# Patient Record
Sex: Female | Born: 1960 | Race: White | Hispanic: No | Marital: Married | Smoking: Former smoker
Health system: Southern US, Community
[De-identification: ages and names within clinical notes are randomized; demographics above are authoritative.]

## PROBLEM LIST (undated history)

## (undated) DIAGNOSIS — N83209 Unspecified ovarian cyst, unspecified side: Secondary | ICD-10-CM

## (undated) DIAGNOSIS — I10 Essential (primary) hypertension: Secondary | ICD-10-CM

## (undated) DIAGNOSIS — M1990 Unspecified osteoarthritis, unspecified site: Secondary | ICD-10-CM

## (undated) DIAGNOSIS — M797 Fibromyalgia: Secondary | ICD-10-CM

## (undated) DIAGNOSIS — K449 Diaphragmatic hernia without obstruction or gangrene: Secondary | ICD-10-CM

## (undated) DIAGNOSIS — D219 Benign neoplasm of connective and other soft tissue, unspecified: Secondary | ICD-10-CM

## (undated) HISTORY — PX: TONSILLECTOMY: SUR1361

## (undated) HISTORY — PX: APPENDECTOMY: SHX54

## (undated) HISTORY — DX: Essential (primary) hypertension: I10

## (undated) HISTORY — PX: LAPAROSCOPY: SHX197

## (undated) HISTORY — DX: Unspecified osteoarthritis, unspecified site: M19.90

---

## 2001-01-13 ENCOUNTER — Emergency Department (HOSPITAL_COMMUNITY): Admission: EM | Admit: 2001-01-13 | Discharge: 2001-01-13 | Payer: Self-pay | Admitting: *Deleted

## 2001-01-13 ENCOUNTER — Encounter: Payer: Self-pay | Admitting: Emergency Medicine

## 2008-03-01 ENCOUNTER — Inpatient Hospital Stay (HOSPITAL_COMMUNITY): Admission: AD | Admit: 2008-03-01 | Discharge: 2008-03-02 | Payer: Self-pay | Admitting: Obstetrics & Gynecology

## 2009-05-09 ENCOUNTER — Emergency Department (HOSPITAL_BASED_OUTPATIENT_CLINIC_OR_DEPARTMENT_OTHER): Admission: EM | Admit: 2009-05-09 | Discharge: 2009-05-09 | Payer: Self-pay | Admitting: Emergency Medicine

## 2009-05-09 ENCOUNTER — Ambulatory Visit: Payer: Self-pay | Admitting: Interventional Radiology

## 2009-05-31 ENCOUNTER — Emergency Department (HOSPITAL_BASED_OUTPATIENT_CLINIC_OR_DEPARTMENT_OTHER): Admission: EM | Admit: 2009-05-31 | Discharge: 2009-05-31 | Payer: Self-pay | Admitting: Emergency Medicine

## 2009-05-31 ENCOUNTER — Ambulatory Visit: Payer: Self-pay | Admitting: Diagnostic Radiology

## 2009-06-20 ENCOUNTER — Emergency Department (HOSPITAL_BASED_OUTPATIENT_CLINIC_OR_DEPARTMENT_OTHER): Admission: EM | Admit: 2009-06-20 | Discharge: 2009-06-20 | Payer: Self-pay | Admitting: Emergency Medicine

## 2009-06-30 ENCOUNTER — Emergency Department (HOSPITAL_BASED_OUTPATIENT_CLINIC_OR_DEPARTMENT_OTHER): Admission: EM | Admit: 2009-06-30 | Discharge: 2009-06-30 | Payer: Self-pay | Admitting: Emergency Medicine

## 2009-09-16 ENCOUNTER — Emergency Department (HOSPITAL_BASED_OUTPATIENT_CLINIC_OR_DEPARTMENT_OTHER): Admission: EM | Admit: 2009-09-16 | Discharge: 2009-09-16 | Payer: Self-pay | Admitting: Emergency Medicine

## 2010-01-12 ENCOUNTER — Encounter
Admission: RE | Admit: 2010-01-12 | Discharge: 2010-01-12 | Payer: Self-pay | Source: Home / Self Care | Attending: Physical Medicine & Rehabilitation | Admitting: Physical Medicine & Rehabilitation

## 2010-04-28 ENCOUNTER — Emergency Department (HOSPITAL_BASED_OUTPATIENT_CLINIC_OR_DEPARTMENT_OTHER)
Admission: EM | Admit: 2010-04-28 | Discharge: 2010-04-28 | Disposition: A | Discharge status: Home / Self Care | Payer: Self-pay | Attending: Obstetrics and Gynecology | Admitting: Obstetrics and Gynecology

## 2010-04-28 DIAGNOSIS — IMO0001 Reserved for inherently not codable concepts without codable children: Secondary | ICD-10-CM | POA: Insufficient documentation

## 2010-04-28 DIAGNOSIS — I10 Essential (primary) hypertension: Secondary | ICD-10-CM | POA: Insufficient documentation

## 2010-04-28 DIAGNOSIS — G8929 Other chronic pain: Secondary | ICD-10-CM | POA: Insufficient documentation

## 2010-04-28 DIAGNOSIS — E669 Obesity, unspecified: Secondary | ICD-10-CM | POA: Insufficient documentation

## 2010-04-28 DIAGNOSIS — Z79899 Other long term (current) drug therapy: Secondary | ICD-10-CM | POA: Insufficient documentation

## 2010-04-28 DIAGNOSIS — M545 Low back pain, unspecified: Secondary | ICD-10-CM | POA: Insufficient documentation

## 2010-04-28 DIAGNOSIS — M542 Cervicalgia: Secondary | ICD-10-CM | POA: Insufficient documentation

## 2010-05-29 LAB — URINALYSIS, ROUTINE W REFLEX MICROSCOPIC
Glucose, UA: NEGATIVE mg/dL
Hgb urine dipstick: NEGATIVE
Specific Gravity, Urine: 1.009 (ref 1.005–1.030)

## 2010-05-30 LAB — URINALYSIS, ROUTINE W REFLEX MICROSCOPIC
Nitrite: NEGATIVE
Specific Gravity, Urine: 1.011 (ref 1.005–1.030)
pH: 6.5 (ref 5.0–8.0)

## 2010-05-30 LAB — PREGNANCY, URINE: Preg Test, Ur: NEGATIVE

## 2010-06-04 LAB — URINALYSIS, ROUTINE W REFLEX MICROSCOPIC
Protein, ur: NEGATIVE mg/dL
Urobilinogen, UA: 0.2 mg/dL (ref 0.0–1.0)

## 2010-06-04 LAB — COMPREHENSIVE METABOLIC PANEL
AST: 35 U/L (ref 0–37)
Albumin: 4.9 g/dL (ref 3.5–5.2)
Chloride: 107 mEq/L (ref 96–112)
Creatinine, Ser: 0.7 mg/dL (ref 0.4–1.2)
GFR calc Af Amer: >60 mL/min (ref >60)
Potassium: 4 mEq/L (ref 3.5–5.1)
Total Bilirubin: 0.4 mg/dL (ref 0.3–1.2)

## 2010-06-04 LAB — DIFFERENTIAL
Basophils Absolute: 0.1 10*3/uL (ref 0.0–0.1)
Eosinophils Relative: 0 % (ref 0–5)
Lymphocytes Relative: 13 % (ref 12–46)
Monocytes Absolute: 0.3 10*3/uL (ref 0.1–1.0)

## 2010-06-04 LAB — CBC
MCV: 89.6 fL (ref 78.0–100.0)
Platelets: 628 10*3/uL — ABNORMAL HIGH (ref 150–400)
WBC: 12.2 10*3/uL — ABNORMAL HIGH (ref 4.0–10.5)

## 2010-06-04 LAB — PREGNANCY, URINE: Preg Test, Ur: NEGATIVE

## 2010-09-04 ENCOUNTER — Emergency Department (INDEPENDENT_AMBULATORY_CARE_PROVIDER_SITE_OTHER): Payer: Self-pay

## 2010-09-04 ENCOUNTER — Emergency Department (HOSPITAL_BASED_OUTPATIENT_CLINIC_OR_DEPARTMENT_OTHER)
Admission: EM | Admit: 2010-09-04 | Discharge: 2010-09-04 | Disposition: A | Discharge status: Home / Self Care | Payer: Self-pay | Attending: Emergency Medicine | Admitting: Emergency Medicine

## 2010-09-04 DIAGNOSIS — IMO0001 Reserved for inherently not codable concepts without codable children: Secondary | ICD-10-CM | POA: Insufficient documentation

## 2010-09-04 DIAGNOSIS — I10 Essential (primary) hypertension: Secondary | ICD-10-CM | POA: Insufficient documentation

## 2010-09-04 DIAGNOSIS — R1032 Left lower quadrant pain: Secondary | ICD-10-CM | POA: Insufficient documentation

## 2010-09-04 DIAGNOSIS — R109 Unspecified abdominal pain: Secondary | ICD-10-CM

## 2010-09-04 DIAGNOSIS — N809 Endometriosis, unspecified: Secondary | ICD-10-CM

## 2010-09-04 LAB — CBC
Hemoglobin: 13.6 g/dL (ref 12.0–15.0)
MCH: 29.3 pg (ref 26.0–34.0)
MCV: 84.3 fL (ref 78.0–100.0)
RBC: 4.64 MIL/uL (ref 3.87–5.11)

## 2010-09-04 LAB — URINALYSIS, ROUTINE W REFLEX MICROSCOPIC
Glucose, UA: NEGATIVE mg/dL
Hgb urine dipstick: NEGATIVE
Specific Gravity, Urine: 1.018 (ref 1.005–1.030)
pH: 5.5 (ref 5.0–8.0)

## 2010-09-04 LAB — DIFFERENTIAL
Lymphocytes Relative: 32 % (ref 12–46)
Lymphs Abs: 2.1 10*3/uL (ref 0.7–4.0)
Monocytes Relative: 8 % (ref 3–12)
Neutro Abs: 3.9 10*3/uL (ref 1.7–7.7)
Neutrophils Relative %: 59 % (ref 43–77)

## 2010-09-04 LAB — BASIC METABOLIC PANEL
CO2: 26 mEq/L (ref 19–32)
Calcium: 10.2 mg/dL (ref 8.4–10.5)
Chloride: 101 mEq/L (ref 96–112)
Creatinine, Ser: 0.5 mg/dL (ref 0.50–1.10)
Glucose, Bld: 99 mg/dL (ref 70–99)

## 2010-09-26 ENCOUNTER — Emergency Department (HOSPITAL_BASED_OUTPATIENT_CLINIC_OR_DEPARTMENT_OTHER)
Admission: EM | Admit: 2010-09-26 | Discharge: 2010-09-26 | Disposition: A | Discharge status: Home / Self Care | Payer: Self-pay | Attending: Emergency Medicine | Admitting: Emergency Medicine

## 2010-09-26 ENCOUNTER — Emergency Department (INDEPENDENT_AMBULATORY_CARE_PROVIDER_SITE_OTHER): Payer: Self-pay

## 2010-09-26 ENCOUNTER — Encounter: Payer: Self-pay | Admitting: *Deleted

## 2010-09-26 DIAGNOSIS — R1032 Left lower quadrant pain: Secondary | ICD-10-CM

## 2010-09-26 DIAGNOSIS — R1084 Generalized abdominal pain: Secondary | ICD-10-CM

## 2010-09-26 DIAGNOSIS — IMO0001 Reserved for inherently not codable concepts without codable children: Secondary | ICD-10-CM | POA: Insufficient documentation

## 2010-09-26 DIAGNOSIS — R110 Nausea: Secondary | ICD-10-CM | POA: Insufficient documentation

## 2010-09-26 DIAGNOSIS — R197 Diarrhea, unspecified: Secondary | ICD-10-CM

## 2010-09-26 HISTORY — DX: Diaphragmatic hernia without obstruction or gangrene: K44.9

## 2010-09-26 HISTORY — DX: Fibromyalgia: M79.7

## 2010-09-26 LAB — DIFFERENTIAL
Eosinophils Absolute: 0.1 10*3/uL (ref 0.0–0.7)
Eosinophils Relative: 1 % (ref 0–5)
Lymphs Abs: 3 10*3/uL (ref 0.7–4.0)
Monocytes Absolute: 0.7 10*3/uL (ref 0.1–1.0)

## 2010-09-26 LAB — COMPREHENSIVE METABOLIC PANEL
ALT: 25 U/L (ref 0–35)
CO2: 22 mEq/L (ref 19–32)
Calcium: 10 mg/dL (ref 8.4–10.5)
Creatinine, Ser: 0.6 mg/dL (ref 0.50–1.10)
GFR calc Af Amer: >60 mL/min (ref >60)
GFR calc non Af Amer: >60 mL/min (ref >60)
Glucose, Bld: 98 mg/dL (ref 70–99)
Sodium: 138 mEq/L (ref 135–145)
Total Protein: 7.9 g/dL (ref 6.0–8.3)

## 2010-09-26 LAB — URINALYSIS, ROUTINE W REFLEX MICROSCOPIC
Ketones, ur: NEGATIVE mg/dL
Leukocytes, UA: NEGATIVE
Nitrite: NEGATIVE
Protein, ur: NEGATIVE mg/dL
Urobilinogen, UA: 0.2 mg/dL (ref 0.0–1.0)

## 2010-09-26 LAB — CBC
HCT: 39.3 % (ref 36.0–46.0)
MCH: 29.5 pg (ref 26.0–34.0)
MCV: 84.5 fL (ref 78.0–100.0)
Platelets: 410 10*3/uL — ABNORMAL HIGH (ref 150–400)
RBC: 4.65 MIL/uL (ref 3.87–5.11)
RDW: 13.9 % (ref 11.5–15.5)

## 2010-09-26 LAB — PREGNANCY, URINE: Preg Test, Ur: NEGATIVE

## 2010-09-26 LAB — LIPASE, BLOOD: Lipase: 54 U/L (ref 11–59)

## 2010-09-26 MED ORDER — HYDROCODONE-ACETAMINOPHEN 5-325 MG PO TABS: 2.0000 | ORAL_TABLET | ORAL | Status: AC | PRN

## 2010-09-26 MED ORDER — SODIUM CHLORIDE 0.9 % IV SOLN
Freq: Once | INTRAVENOUS | Status: AC
  Administered 2010-09-26: 19:00:00 via INTRAVENOUS

## 2010-09-26 MED ORDER — IOHEXOL 300 MG/ML  SOLN
100.0000 mL | Freq: Once | INTRAMUSCULAR | Status: AC | PRN
  Administered 2010-09-26: 100 mL via INTRAVENOUS

## 2010-09-26 MED ORDER — ONDANSETRON HCL 4 MG/2ML IJ SOLN
4.0000 mg | Freq: Once | INTRAMUSCULAR | Status: AC
  Administered 2010-09-26: 4 mg via INTRAVENOUS
  Filled 2010-09-26: qty 2

## 2010-09-26 MED ORDER — HYDROMORPHONE HCL 1 MG/ML IJ SOLN
1.0000 mg | Freq: Once | INTRAMUSCULAR | Status: AC
  Administered 2010-09-26: 1 mg via INTRAVENOUS
  Filled 2010-09-26: qty 1

## 2010-09-26 NOTE — ED Provider Notes (Signed)
History     Chief Complaint  Patient presents with  . Abdominal Pain   Patient is a 50 y.o. female presenting with abdominal pain. The history is provided by the patient.  Abdominal Pain The primary symptoms of the illness include abdominal pain and nausea. The current episode started 2 days ago. The onset of the illness was gradual. The problem has been gradually worsening.  The patient states that she believes she is currently not pregnant. The patient has not had a change in bowel habit.    Past Medical History  Diagnosis Date  . Endometriosis   . Hiatal hernia   . Fibromyalgia     Past Surgical History  Procedure Date  . Tonsillectomy     History reviewed. No pertinent family history.  History  Substance Use Topics  . Smoking status: Never Smoker   . Smokeless tobacco: Not on file  . Alcohol Use: No    OB History    Grav Para Term Preterm Abortions TAB SAB Ect Mult Living                  Review of Systems  Gastrointestinal: Positive for nausea and abdominal pain.  All other systems reviewed and are negative.    Physical Exam  BP 160/76  Pulse 89  Temp(Src) 99.1 F (37.3 C) (Oral)  Resp 16  Wt 220 lb (99.791 kg)  SpO2 100%  LMP 08/10/2010  Physical Exam  Constitutional: She is oriented to person, place, and time. She appears well-developed and well-nourished.  HENT:  Head: Normocephalic.  Eyes: Conjunctivae are normal. Pupils are equal, round, and reactive to light.  Neck: Normal range of motion. Neck supple.  Cardiovascular: Normal rate.   Pulmonary/Chest: Effort normal.  Abdominal: Soft. There is tenderness. There is guarding.  Musculoskeletal: Normal range of motion.  Neurological: She is alert and oriented to person, place, and time.  Skin: Skin is warm.  Psychiatric: She has a normal mood and affect.    ED Course  Procedures  MDM Pt reports her doctor has referred her to pain management for fibromyalgia.  Pt reports she haspain from  endometrosis as well.  Medical screening examination/treatment/procedure(s) were performed by non-physician practitioner and as supervising physician I was immediately available for consultation/collaboration.    Langston Masker, [place] 09/26/10 1925  Suzi Roots, MD 09/27/10 [place] [facility]

## 2010-09-26 NOTE — ED Notes (Signed)
Pt returned from CT.  Improvement in overall condition.

## 2010-09-26 NOTE — ED Notes (Signed)
Pt transported to CT ?

## 2010-09-26 NOTE — ED Notes (Signed)
IV started, medicated and pt drinking PO contrast.

## 2010-09-26 NOTE — ED Notes (Signed)
Pt c/o left side abd pain x 2 days, Hx of same, F/u appt this month with [facility] pain management. PT also c/o n/d.

## 2010-09-26 NOTE — ED Notes (Signed)
Pt ambulatory to BR and returned to room.  NAD.

## 2010-10-02 ENCOUNTER — Encounter (HOSPITAL_BASED_OUTPATIENT_CLINIC_OR_DEPARTMENT_OTHER): Payer: Self-pay | Admitting: *Deleted

## 2010-10-02 ENCOUNTER — Emergency Department (INDEPENDENT_AMBULATORY_CARE_PROVIDER_SITE_OTHER): Payer: Self-pay

## 2010-10-02 ENCOUNTER — Emergency Department (HOSPITAL_BASED_OUTPATIENT_CLINIC_OR_DEPARTMENT_OTHER)
Admission: EM | Admit: 2010-10-02 | Discharge: 2010-10-02 | Disposition: A | Discharge status: Home / Self Care | Payer: Self-pay | Attending: Emergency Medicine | Admitting: Emergency Medicine

## 2010-10-02 DIAGNOSIS — W2203XA Walked into furniture, initial encounter: Secondary | ICD-10-CM | POA: Insufficient documentation

## 2010-10-02 DIAGNOSIS — S93699A Other sprain of unspecified foot, initial encounter: Secondary | ICD-10-CM | POA: Insufficient documentation

## 2010-10-02 DIAGNOSIS — Y92009 Unspecified place in unspecified non-institutional (private) residence as the place of occurrence of the external cause: Secondary | ICD-10-CM | POA: Insufficient documentation

## 2010-10-02 DIAGNOSIS — M79609 Pain in unspecified limb: Secondary | ICD-10-CM

## 2010-10-02 DIAGNOSIS — R51 Headache: Secondary | ICD-10-CM

## 2010-10-02 DIAGNOSIS — IMO0001 Reserved for inherently not codable concepts without codable children: Secondary | ICD-10-CM | POA: Insufficient documentation

## 2010-10-02 DIAGNOSIS — M25579 Pain in unspecified ankle and joints of unspecified foot: Secondary | ICD-10-CM

## 2010-10-02 DIAGNOSIS — R609 Edema, unspecified: Secondary | ICD-10-CM

## 2010-10-02 DIAGNOSIS — M7750 Other enthesopathy of unspecified foot: Secondary | ICD-10-CM

## 2010-10-02 DIAGNOSIS — IMO0002 Reserved for concepts with insufficient information to code with codable children: Secondary | ICD-10-CM

## 2010-10-02 MED ORDER — TRAMADOL HCL 50 MG PO TABS: 50.0000 mg | ORAL_TABLET | Freq: Four times a day (QID) | ORAL | Status: AC | PRN

## 2010-10-02 NOTE — ED Provider Notes (Signed)
History     Chief Complaint  Patient presents with  . Foot Pain  . Ankle Pain   HPI Comments: Pt states that she opened a drawer onto her foot and has continued to have pain  Patient is a 50 y.o. female presenting with lower extremity pain and ankle pain. The history is provided by the patient. No language interpreter was used.  Foot Pain This is a new problem. The current episode started in the past 7 days. The problem occurs constantly. The problem has been gradually worsening. The symptoms are aggravated by standing. She has tried nothing for the symptoms.  Ankle Pain  The incident occurred 2 days ago. The incident occurred at home. The injury mechanism was a direct blow. The pain is present in the left foot. The quality of the pain is described as aching. The pain is at a severity of 3/10. The pain is mild. The pain has been constant since onset. Pertinent negatives include no muscle weakness, no loss of sensation and no tingling. She reports no foreign bodies present. The symptoms are aggravated by nothing. She has tried nothing for the symptoms. The treatment provided no relief.    Past Medical History  Diagnosis Date  . Endometriosis   . Hiatal hernia   . Fibromyalgia     Past Surgical History  Procedure Date  . Tonsillectomy     No family history on file.  History  Substance Use Topics  . Smoking status: Never Smoker   . Smokeless tobacco: Not on file  . Alcohol Use: No    OB History    Grav Para Term Preterm Abortions TAB SAB Ect Mult Living                  Review of Systems  Neurological: Negative for tingling.    Physical Exam  BP 163/99  Pulse 110  Temp(Src) 98.9 F (37.2 C) (Oral)  Resp 16  Ht 5\' 4"  (1.626 m)  Wt 230 lb (104.327 kg)  BMI 39.48 kg/m2  SpO2 98%  LMP 08/10/2010  Physical Exam  Vitals reviewed. Constitutional: She is oriented to person, place, and time. She appears well-developed and well-nourished.  HENT:  Head:  Normocephalic.  Neck: Normal range of motion.  Cardiovascular: Normal rate and regular rhythm.   Pulmonary/Chest: Effort normal and breath sounds normal.  Musculoskeletal:       Right ankle: Normal.       Right foot: Normal.  Neurological: She is alert and oriented to person, place, and time.    ED Course  Procedures   Ankle Complete Left  10/02/2010  *RADIOLOGY REPORT*  Clinical Data: Pain/injury to the nose, lateral ankle swelling  LEFT ANKLE COMPLETE - 3+ VIEW  Comparison: None.  Findings: No fracture or dislocation is seen.  The ankle mortise is preserved.  Possible mild generalized ankle swelling, most prominent medially.  IMPRESSION: No fracture or dislocation is seen.  Original Report Authenticated By: Charline Bills, M.D.   Dg Foot Complete Left  10/02/2010  *RADIOLOGY REPORT*  Clinical Data: Pain/injury to toes  LEFT FOOT - COMPLETE 3+ VIEW  Comparison: None.  Findings: No fracture or dislocation is seen.  The joint spaces are preserved.  The visualized soft tissues are unremarkable.  Plantar calcaneal enthesopathy.  IMPRESSION: No fracture or dislocation is seen.  Original Report Authenticated By: Charline Bills, M.D.         MDM No acute process noted:pt okay to go home;pt is requesting ultram for  home      Teressa Lower, [place] 10/02/10 1433

## 2010-10-02 NOTE — ED Notes (Signed)
Pt says a dresser drawer fell onto her left foot on Saturday.  She c/o pain in left foot and swelling in left ankle.

## 2010-10-03 NOTE — ED Provider Notes (Signed)
Medical screening examination/treatment/procedure(s) were performed by non-physician practitioner and as supervising physician I was immediately available for consultation/collaboration.  Estephania Licciardi 10/03/10 1542 

## 2010-11-29 ENCOUNTER — Emergency Department (HOSPITAL_COMMUNITY)
Admission: EM | Admit: 2010-11-29 | Discharge: 2010-11-29 | Disposition: A | Discharge status: Home / Self Care | Payer: Self-pay | Attending: Emergency Medicine | Admitting: Emergency Medicine

## 2010-11-29 DIAGNOSIS — R51 Headache: Secondary | ICD-10-CM | POA: Insufficient documentation

## 2010-11-29 DIAGNOSIS — H9209 Otalgia, unspecified ear: Secondary | ICD-10-CM | POA: Insufficient documentation

## 2010-11-29 DIAGNOSIS — I10 Essential (primary) hypertension: Secondary | ICD-10-CM | POA: Insufficient documentation

## 2010-12-14 LAB — CBC
HCT: 36.6 % (ref 36.0–46.0)
MCHC: 33.9 g/dL (ref 30.0–36.0)
MCV: 89.4 fL (ref 78.0–100.0)
Platelets: 387 10*3/uL (ref 150–400)
WBC: 10.5 10*3/uL (ref 4.0–10.5)

## 2010-12-14 LAB — DIFFERENTIAL
Basophils Absolute: 0 10*3/uL (ref 0.0–0.1)
Lymphocytes Relative: 27 % (ref 12–46)
Lymphs Abs: 2.8 10*3/uL (ref 0.7–4.0)
Monocytes Absolute: 0.8 10*3/uL (ref 0.1–1.0)
Neutro Abs: 6.6 10*3/uL (ref 1.7–7.7)

## 2010-12-14 LAB — COMPREHENSIVE METABOLIC PANEL
Albumin: 3.4 g/dL — ABNORMAL LOW (ref 3.5–5.2)
BUN: 7 mg/dL (ref 6–23)
Calcium: 9.5 mg/dL (ref 8.4–10.5)
Chloride: 101 mEq/L (ref 96–112)
Creatinine, Ser: 0.63 mg/dL (ref 0.4–1.2)
GFR calc Af Amer: >60 mL/min (ref >60)
GFR calc non Af Amer: >60 mL/min (ref >60)
Total Bilirubin: 0.2 mg/dL — ABNORMAL LOW (ref 0.3–1.2)

## 2010-12-14 LAB — GC/CHLAMYDIA PROBE AMP, GENITAL: Chlamydia, DNA Probe: NEGATIVE

## 2010-12-14 LAB — WET PREP, GENITAL
Trich, Wet Prep: NONE SEEN
Yeast Wet Prep HPF POC: NONE SEEN

## 2010-12-14 LAB — HCG, SERUM, QUALITATIVE: Preg, Serum: NEGATIVE

## 2011-01-31 ENCOUNTER — Encounter (HOSPITAL_BASED_OUTPATIENT_CLINIC_OR_DEPARTMENT_OTHER): Payer: Self-pay | Admitting: *Deleted

## 2011-01-31 ENCOUNTER — Emergency Department (HOSPITAL_BASED_OUTPATIENT_CLINIC_OR_DEPARTMENT_OTHER)
Admission: EM | Admit: 2011-01-31 | Discharge: 2011-01-31 | Disposition: A | Discharge status: Home / Self Care | Payer: Self-pay | Attending: Emergency Medicine | Admitting: Emergency Medicine

## 2011-01-31 DIAGNOSIS — IMO0001 Reserved for inherently not codable concepts without codable children: Secondary | ICD-10-CM | POA: Insufficient documentation

## 2011-01-31 DIAGNOSIS — R109 Unspecified abdominal pain: Secondary | ICD-10-CM | POA: Insufficient documentation

## 2011-01-31 HISTORY — DX: Unspecified ovarian cyst, unspecified side: N83.209

## 2011-01-31 LAB — URINALYSIS, ROUTINE W REFLEX MICROSCOPIC
Bilirubin Urine: NEGATIVE
Glucose, UA: NEGATIVE mg/dL
Hgb urine dipstick: NEGATIVE
Ketones, ur: NEGATIVE mg/dL
pH: 7 (ref 5.0–8.0)

## 2011-01-31 MED ORDER — TRAMADOL HCL 50 MG PO TABS
50.0000 mg | ORAL_TABLET | Freq: Once | ORAL | Status: AC
  Administered 2011-01-31: 50 mg via ORAL
  Filled 2011-01-31: qty 1

## 2011-01-31 MED ORDER — TRAMADOL HCL 50 MG PO TABS: 50.0000 mg | ORAL_TABLET | Freq: Four times a day (QID) | ORAL | Status: AC | PRN

## 2011-01-31 NOTE — ED Provider Notes (Signed)
Medical screening examination/treatment/procedure(s) were performed by non-physician practitioner and as supervising physician I was immediately available for consultation/collaboration.   Forbes Cellar, MD 01/31/11 [phone]

## 2011-01-31 NOTE — ED Provider Notes (Addendum)
History     CSN: [id number] Arrival date & time: 01/31/2011  1:19 PM   First MD Initiated Contact with Patient 01/31/11 1335      Chief Complaint  Patient presents with  . Abdominal Pain    (Consider location/radiation/quality/duration/timing/severity/associated sxs/prior treatment) HPI Comments: Pt states that she got her period for the first time in a couple of months and she uses tramadol for pain and she ran out of it:pt states that the bleeding has just about stopped  Patient is a 50 y.o. female presenting with abdominal pain. The history is provided by the patient. No language interpreter was used.  Abdominal Pain The primary symptoms of the illness include abdominal pain. The primary symptoms of the illness do not include nausea, vomiting or dysuria. The current episode started more than 2 days ago. The onset of the illness was gradual. The problem has not changed since onset. The patient states that she believes she is currently not pregnant. The patient has not had a change in bowel habit. Symptoms associated with the illness do not include urgency or hematuria.    Past Medical History  Diagnosis Date  . Endometriosis   . Hiatal hernia   . Fibromyalgia   . Ovarian cyst     Past Surgical History  Procedure Date  . Tonsillectomy     History reviewed. No pertinent family history.  History  Substance Use Topics  . Smoking status: Never Smoker   . Smokeless tobacco: Not on file  . Alcohol Use: No    OB History    Grav Para Term Preterm Abortions TAB SAB Ect Mult Living                  Review of Systems  Gastrointestinal: Positive for abdominal pain. Negative for nausea and vomiting.  Genitourinary: Negative for dysuria, urgency and hematuria.  All other systems reviewed and are negative.    Allergies  Morphine and related and Rocephin  Home Medications   Current Outpatient Rx  Name Route Sig Dispense Refill  . CARISOPRODOL 350 MG PO TABS Oral Take  350 mg by mouth 4 (four) times daily as needed.      Marland Kitchen TRAMADOL HCL 50 MG PO TABS Oral Take 50 mg by mouth every 6 (six) hours as needed.        BP 165/96  Pulse 100  Temp(Src) 98.1 F (36.7 C) (Oral)  Resp 16  Ht 5\' 3"  (1.6 m)  Wt 230 lb (104.327 kg)  BMI 40.74 kg/m2  SpO2 99%  LMP 01/29/2011  Physical Exam  Nursing note and vitals reviewed. Constitutional: She is oriented to person, place, and time. She appears well-developed and well-nourished.  HENT:  Head: Normocephalic and atraumatic.  Cardiovascular: Normal rate and regular rhythm.   Pulmonary/Chest: Effort normal and breath sounds normal.  Abdominal: Soft. Bowel sounds are normal. There is no tenderness.  Musculoskeletal: Normal range of motion.  Neurological: She is alert and oriented to person, place, and time.  Skin: Skin is warm and dry.    ED Course  Procedures (including critical care time)   Labs Reviewed  URINALYSIS, ROUTINE W REFLEX MICROSCOPIC  PREGNANCY, URINE   No results found.   No diagnosis found.    MDM  Abdomen non acute:will treat symptomatically        Teressa Lower, NP 01/31/11 1431  Teressa Lower, NP 01/31/11 1441

## 2011-01-31 NOTE — ED Notes (Signed)
Pt c/o lower abd pain with vaginal bleeding x 3 days, pt states she is out of tramadol

## 2011-01-31 NOTE — ED Provider Notes (Signed)
Medical screening examination/treatment/procedure(s) were performed by non-physician practitioner and as supervising physician I was immediately available for consultation/collaboration.   Leigh-Ann Magaby Rumberger, MD 01/31/11 1450 

## 2011-05-16 ENCOUNTER — Emergency Department (HOSPITAL_BASED_OUTPATIENT_CLINIC_OR_DEPARTMENT_OTHER)
Admission: EM | Admit: 2011-05-16 | Discharge: 2011-05-16 | Disposition: A | Discharge status: Home / Self Care | Payer: Self-pay | Attending: Emergency Medicine | Admitting: Emergency Medicine

## 2011-05-16 ENCOUNTER — Encounter (HOSPITAL_BASED_OUTPATIENT_CLINIC_OR_DEPARTMENT_OTHER): Payer: Self-pay | Admitting: *Deleted

## 2011-05-16 DIAGNOSIS — Z79899 Other long term (current) drug therapy: Secondary | ICD-10-CM | POA: Insufficient documentation

## 2011-05-16 DIAGNOSIS — M79609 Pain in unspecified limb: Secondary | ICD-10-CM | POA: Insufficient documentation

## 2011-05-16 DIAGNOSIS — L03019 Cellulitis of unspecified finger: Secondary | ICD-10-CM | POA: Insufficient documentation

## 2011-05-16 DIAGNOSIS — M7989 Other specified soft tissue disorders: Secondary | ICD-10-CM | POA: Insufficient documentation

## 2011-05-16 MED ORDER — TRAMADOL HCL 50 MG PO TABS: 50.0000 mg | ORAL_TABLET | Freq: Four times a day (QID) | ORAL | Status: AC | PRN

## 2011-05-16 MED ORDER — OXYCODONE-ACETAMINOPHEN 5-325 MG PO TABS
2.0000 | ORAL_TABLET | Freq: Once | ORAL | Status: AC
  Administered 2011-05-16: 2 via ORAL
  Filled 2011-05-16: qty 2

## 2011-05-16 MED ORDER — LIDOCAINE HCL 2 % IJ SOLN
20.0000 mL | Freq: Once | INTRAMUSCULAR | Status: AC
  Administered 2011-05-16: 20 mg via INTRADERMAL
  Filled 2011-05-16: qty 1

## 2011-05-16 NOTE — ED Provider Notes (Signed)
History     CSN: [id number]  Arrival date & time 05/16/11  1124   First MD Initiated Contact with Patient 05/16/11 1220      Chief Complaint  Patient presents with  . Wound Infection    Right thumb  . Medication Refill    (Consider location/radiation/quality/duration/timing/severity/associated sxs/prior treatment) HPI Patient follow with pain management due to fibromyalgia.  She has not been to see since February 1 and has not taking oxycodone but had soma filled.  Patient hd infection of right thumb treated by Dr. Ivory Broad and treated with sulfa antibiotics five days ago and states it is not healing.  She was supposed to see Dr. Ivory Broad if not better by Monday.  She was unable to go back to see both doctors due to financial reasons so came here.    Past Medical History  Diagnosis Date  . Endometriosis   . Hiatal hernia   . Fibromyalgia   . Ovarian cyst     Past Surgical History  Procedure Date  . Tonsillectomy     No family history on file.  History  Substance Use Topics  . Smoking status: Former Games developer  . Smokeless tobacco: Not on file  . Alcohol Use: No    OB History    Grav Para Term Preterm Abortions TAB SAB Ect Mult Living                  Review of Systems  All other systems reviewed and are negative.    Allergies  Morphine and related and Rocephin  Home Medications   Current Outpatient Rx  Name Route Sig Dispense Refill  . OXYCODONE-ACETAMINOPHEN 5-325 MG PO TABS Oral Take 2 tablets by mouth every 6 (six) hours as needed.    Marland Kitchen CARISOPRODOL 350 MG PO TABS Oral Take 350 mg by mouth 4 (four) times daily as needed.      Marland Kitchen TRAMADOL HCL 50 MG PO TABS Oral Take 50 mg by mouth every 6 (six) hours as needed.        BP 133/89  Pulse 100  Temp(Src) 98 F (36.7 C) (Oral)  Resp 20  Ht 5\' 4"  (1.626 m)  Wt 230 lb (104.327 kg)  BMI 39.48 kg/m2  SpO2 100%  LMP 04/18/2011  Physical Exam  Nursing note and vitals reviewed. Constitutional: She is oriented to  person, place, and time. She appears well-developed and well-nourished.  HENT:  Head: Normocephalic and atraumatic.  Eyes: Conjunctivae and EOM are normal. Pupils are equal, round, and reactive to light.  Neck: Normal range of motion.  Cardiovascular: Normal rate and regular rhythm.   Pulmonary/Chest: Effort normal.  Abdominal: Soft.  Musculoskeletal:       Left thumb with paronychial redness and swelling  Neurological: She is alert and oriented to person, place, and time.  Skin: Skin is warm and dry.  Psychiatric: She has a normal mood and affect.    ED Course  INCISION AND DRAINAGE Date/Time: 05/16/2011 1:07 PM Performed by: Hilario Quarry Authorized by: Hilario Quarry Consent: Verbal consent obtained. Written consent obtained. Risks and benefits: risks, benefits and alternatives were discussed Patient identity confirmed: verbally with patient Time out: Immediately prior to procedure a "time out" was called to verify the correct patient, procedure, equipment, support staff and site/side marked as required. Type: abscess Body area: upper extremity Location details: right thumb Anesthesia: digital block Local anesthetic: lidocaine 1% without epinephrine Anesthetic total: 4 ml Patient sedated: no Scalpel size: 11 Needle  gauge: 22 Incision type: single straight Complexity: simple Drainage: serosanguinous Drainage amount: moderate Wound treatment: wound left open   (including critical care time)  Labs Reviewed - No data to display No results found.   No diagnosis found.    MDM          Hilario Quarry, MD 05/16/11 1308

## 2011-05-16 NOTE — ED Notes (Signed)
Pt amb to room 6 with quick steady gait in nad. Pt reports hitting her thumb on a wall last week, saw her pcp who dx infection, "he said it was mrsa", pt states she is on day 5 of sulfa antibiotics, unsure of name, cont with swelling and pain. Pt was to follow up with her pcp but cannot afford another office visit. She also cannot afford to go to her pain clinic, and states she needs pain medications also.

## 2011-05-16 NOTE — Discharge Instructions (Signed)
Paronychia  Paronychia is an inflammatory reaction involving the folds of the skin surrounding the fingernail. This is commonly caused by an infection in the skin around a nail. The most common cause of paronychia is frequent wetting of the hands (as seen with bartenders, food servers, nurses or others who wet their hands). This makes the skin around the fingernail susceptible to infection by bacteria (germs) or fungus. Other predisposing factors are:   Aggressive manicuring.   Nail biting.   Thumb sucking.  The most common cause is a staphylococcal (a type of germ) infection, or a fungal (Candida) infection. When caused by a germ, it usually comes on suddenly with redness, swelling, pus and is often painful. It may get under the nail and form an abscess (collection of pus), or form an abscess around the nail. If the nail itself is infected with a fungus, the treatment is usually prolonged and may require oral medicine for up to one year. Your caregiver will determine the length of time treatment is required. The paronychia caused by bacteria (germs) may largely be avoided by not pulling on hangnails or picking at cuticles. When the infection occurs at the tips of the finger it is called felon. When the cause of paronychia is from the herpes simplex virus (HSV) it is called herpetic whitlow.  TREATMENT   When an abscess is present treatment is often incision and drainage. This means that the abscess must be cut open so the pus can get out. When this is done, the following home care instructions should be followed.  HOME CARE INSTRUCTIONS    It is important to keep the affected fingers very dry. Rubber or plastic gloves over cotton gloves should be used whenever the hand must be placed in water.   Keep wound clean, dry and dressed as suggested by your caregiver between warm soaks or warm compresses.   Soak in warm water for fifteen to twenty minutes three to four times per day for bacterial infections. Fungal  infections are very difficult to treat, so often require treatment for long periods of time.   For bacterial (germ) infections take antibiotics (medicine which kill germs) as directed and finish the prescription, even if the problem appears to be solved before the medicine is gone.   Only take over-the-counter or prescription medicines for pain, discomfort, or fever as directed by your caregiver.  SEEK IMMEDIATE MEDICAL CARE IF:   You have redness, swelling, or increasing pain in the wound.   You notice pus coming from the wound.   You have a fever.   You notice a bad smell coming from the wound or dressing.  Document Released: 08/21/2000 Document Revised: 02/14/2011 Document Reviewed: 04/22/2008  [employer] Patient Information 2012 [employer]

## 2011-05-16 NOTE — ED Notes (Signed)
Patient reports that 5 days ago she injured her right thumb and was seen by her PCP for the possible infection.  Patient was given antibiotic for thumb.  Patient also reports that she has been out of her pain medications for approx 7 days and has started experiencing head, neck and back spasms.

## 2011-07-07 ENCOUNTER — Encounter (HOSPITAL_BASED_OUTPATIENT_CLINIC_OR_DEPARTMENT_OTHER): Payer: Self-pay

## 2011-07-07 ENCOUNTER — Emergency Department (HOSPITAL_BASED_OUTPATIENT_CLINIC_OR_DEPARTMENT_OTHER)
Admission: EM | Admit: 2011-07-07 | Discharge: 2011-07-07 | Disposition: A | Discharge status: Home / Self Care | Payer: Self-pay | Attending: Emergency Medicine | Admitting: Emergency Medicine

## 2011-07-07 DIAGNOSIS — R000 Tachycardia, unspecified: Secondary | ICD-10-CM | POA: Insufficient documentation

## 2011-07-07 DIAGNOSIS — M1710 Unilateral primary osteoarthritis, unspecified knee: Secondary | ICD-10-CM | POA: Insufficient documentation

## 2011-07-07 DIAGNOSIS — M25569 Pain in unspecified knee: Secondary | ICD-10-CM | POA: Insufficient documentation

## 2011-07-07 DIAGNOSIS — IMO0001 Reserved for inherently not codable concepts without codable children: Secondary | ICD-10-CM | POA: Insufficient documentation

## 2011-07-07 DIAGNOSIS — I10 Essential (primary) hypertension: Secondary | ICD-10-CM | POA: Insufficient documentation

## 2011-07-07 DIAGNOSIS — R109 Unspecified abdominal pain: Secondary | ICD-10-CM | POA: Insufficient documentation

## 2011-07-07 MED ORDER — HYDROCODONE-ACETAMINOPHEN 5-325 MG PO TABS
ORAL_TABLET | ORAL | Status: AC
  Administered 2011-07-07: 2 via ORAL
  Filled 2011-07-07: qty 2

## 2011-07-07 MED ORDER — TRAMADOL HCL 50 MG PO TABS: 50.0000 mg | ORAL_TABLET | Freq: Four times a day (QID) | ORAL | Status: AC | PRN

## 2011-07-07 MED ORDER — HYDROCODONE-ACETAMINOPHEN 5-325 MG PO TABS
2.0000 | ORAL_TABLET | Freq: Once | ORAL | Status: AC
  Administered 2011-07-07: 2 via ORAL

## 2011-07-07 NOTE — ED Notes (Signed)
Pt with multiple complaints r/t pain and lack of pain management at home, pt reports that she is out of her tramadol and oxycodone which she takes for pain at home.  Pt states that she has pain contract with a doctor but that she cannot afford to go back to him because she does not have insurance.  Pt states that she has bilateral knee pain and her L knee is "giving out".

## 2011-07-07 NOTE — Discharge Instructions (Signed)
Knee Pain The knee is the complex joint between your thigh and your lower leg. It is made up of bones, tendons, ligaments, and cartilage. The bones that make up the knee are:  The femur in the thigh.   The tibia and fibula in the lower leg.   The patella or kneecap riding in the groove on the lower femur.  CAUSES  Knee pain is a common complaint with many causes. A few of these causes are:  Injury, such as:   A ruptured ligament or tendon injury.   Torn cartilage.   Medical conditions, such as:   Gout   Arthritis   Infections   Overuse, over training or overdoing a physical activity.  Knee pain can be minor or severe. Knee pain can accompany debilitating injury. Minor knee problems often respond well to self-care measures or get well on their own. More serious injuries may need medical intervention or even surgery. SYMPTOMS The knee is complex. Symptoms of knee problems can vary widely. Some of the problems are:  Pain with movement and weight bearing.   Swelling and tenderness.   Buckling of the knee.   Inability to straighten or extend your knee.   Your knee locks and you cannot straighten it.   Warmth and redness with pain and fever.   Deformity or dislocation of the kneecap.  DIAGNOSIS  Determining what is wrong may be very straight forward such as when there is an injury. It can also be challenging because of the complexity of the knee. Tests to make a diagnosis may include:  Your caregiver taking a history and doing a physical exam.   Routine X-rays can be used to rule out other problems. X-rays will not reveal a cartilage tear. Some injuries of the knee can be diagnosed by:   Arthroscopy a surgical technique by which a small video camera is inserted through tiny incisions on the sides of the knee. This procedure is used to examine and repair internal knee joint problems. Tiny instruments can be used during arthroscopy to repair the torn knee cartilage  (meniscus).   Arthrography is a radiology technique. A contrast liquid is directly injected into the knee joint. Internal structures of the knee joint then become visible on X-ray film.   An MRI scan is a non x-ray radiology procedure in which magnetic fields and a computer produce two- or three-dimensional images of the inside of the knee. Cartilage tears are often visible using an MRI scanner. MRI scans have largely replaced arthrography in diagnosing cartilage tears of the knee.   Blood work.   Examination of the fluid that helps to lubricate the knee joint (synovial fluid). This is done by taking a sample out using a needle and a syringe.  TREATMENT The treatment of knee problems depends on the cause. Some of these treatments are:  Depending on the injury, proper casting, splinting, surgery or physical therapy care will be needed.   Give yourself adequate recovery time. Do not overuse your joints. If you begin to get sore during workout routines, back off. Slow down or do fewer repetitions.   For repetitive activities such as cycling or running, maintain your strength and nutrition.   Alternate muscle groups. For example if you are a weight lifter, work the upper body on one day and the lower body the next.   Either tight or weak muscles do not give the proper support for your knee. Tight or weak muscles do not absorb the stress placed   on the knee joint. Keep the muscles surrounding the knee strong.   Take care of mechanical problems.   If you have flat feet, orthotics or special shoes may help. See your caregiver if you need help.   Arch supports, sometimes with wedges on the inner or outer aspect of the heel, can help. These can shift pressure away from the side of the knee most bothered by osteoarthritis.   A brace called an "unloader" brace also may be used to help ease the pressure on the most arthritic side of the knee.   If your caregiver has prescribed crutches, braces,  wraps or ice, use as directed. The acronym for this is PRICE. This means protection, rest, ice, compression and elevation.   Nonsteroidal anti-inflammatory drugs (NSAID's), can help relieve pain. But if taken immediately after an injury, they may actually increase swelling. Take NSAID's with food in your stomach. Stop them if you develop stomach problems. Do not take these if you have a history of ulcers, stomach pain or bleeding from the bowel. Do not take without your caregiver's approval if you have problems with fluid retention, heart failure, or kidney problems.   For ongoing knee problems, physical therapy may be helpful.   Glucosamine and chondroitin are over-the-counter dietary supplements. Both may help relieve the pain of osteoarthritis in the knee. These medicines are different from the usual anti-inflammatory drugs. Glucosamine may decrease the rate of cartilage destruction.   Injections of a corticosteroid drug into your knee joint may help reduce the symptoms of an arthritis flare-up. They may provide pain relief that lasts a few months. You may have to wait a few months between injections. The injections do have a small increased risk of infection, water retention and elevated blood sugar levels.   Hyaluronic acid injected into damaged joints may ease pain and provide lubrication. These injections may work by reducing inflammation. A series of shots may give relief for as long as 6 months.   Topical painkillers. Applying certain ointments to your skin may help relieve the pain and stiffness of osteoarthritis. Ask your pharmacist for suggestions. Many over the-counter products are approved for temporary relief of arthritis pain.   In some countries, doctors often prescribe topical NSAID's for relief of chronic conditions such as arthritis and tendinitis. A review of treatment with NSAID creams found that they worked as well as oral medications but without the serious side effects.    PREVENTION  Maintain a healthy weight. Extra pounds put more strain on your joints.   Get strong, stay limber. Weak muscles are a common cause of knee injuries. Stretching is important. Include flexibility exercises in your workouts.   Be smart about exercise. If you have osteoarthritis, chronic knee pain or recurring injuries, you may need to change the way you exercise. This does not mean you have to stop being active. If your knees ache after jogging or playing basketball, consider switching to swimming, water aerobics or other low-impact activities, at least for a few days a week. Sometimes limiting high-impact activities will provide relief.   Make sure your shoes fit well. Choose footwear that is right for your sport.   Protect your knees. Use the proper gear for knee-sensitive activities. Use kneepads when playing volleyball or laying carpet. Buckle your seat belt every time you drive. Most shattered kneecaps occur in car accidents.   Rest when you are tired.  SEEK MEDICAL CARE IF:  You have knee pain that is continual and does not   seem to be getting better.  SEEK IMMEDIATE MEDICAL CARE IF:  Your knee joint feels hot to the touch and you have a high fever. MAKE SURE YOU:   Understand these instructions.   Will watch your condition.   Will get help right away if you are not doing well or get worse.  Document Released: 12/23/2006 Document Revised: 02/14/2011 Document Reviewed: 12/23/2006 [facility] Patient Information 2012 [place], [place].    Call Dr. Ivory Broad to arrange to your blood pressure rechecked within a week. Take your Benicar when you get home today. Call Dr.DUDA if you wish to get your knees evaluated by an orthopedic surgeon. Take tramadol as needed for pain

## 2011-07-07 NOTE — ED Provider Notes (Signed)
History     CSN: [id number]  Arrival date & time 07/07/11  1055   First MD Initiated Contact with Patient 07/07/11 1211      Chief Complaint  Patient presents with  . Pain    (Consider location/radiation/quality/duration/timing/severity/associated sxs/prior treatment) HPI Complains of left knee pain for one week and right knee pain for one year. She's been evaluated by [facility]oped[facility] for right knee pain and cold that she has severe arthritis and needs a knee replacement. Left knee pain started one week ago which is worse with walking. She's been treating pain with tramadol with some relief , however she ran out of tramadol this morning. Tylenol also gives her some relief her in patient also complains of left flank pain which is chronic for several years which is typical of her fibromyalgia. She denies having a pain contract or pain management clinic in contradiction to nurse's notes no other associated symptoms. No other complaint.  Past Medical History  Diagnosis Date  . Endometriosis   . Hiatal hernia   . Fibromyalgia   . Ovarian cyst    hypertension ; current medications include Benicar; Past Surgical History  Procedure Date  . Tonsillectomy     History reviewed. No pertinent family history.  History  Substance Use Topics  . Smoking status: Former Games developer  . Smokeless tobacco: Not on file  . Alcohol Use: No    OB History    Grav Para Term Preterm Abortions TAB SAB Ect Mult Living                  Review of Systems  Genitourinary: Positive for flank pain.  Musculoskeletal: Positive for arthralgias.  All other systems reviewed and are negative.    Allergies  Morphine and related and Rocephin  Home Medications   Current Outpatient Rx  Name Route Sig Dispense Refill  . CARISOPRODOL 350 MG PO TABS Oral Take 350 mg by mouth 4 (four) times daily as needed.      . OXYCODONE-ACETAMINOPHEN 5-325 MG PO TABS Oral Take 2 tablets by mouth every 6 (six) hours as  needed.    Marland Kitchen TRAMADOL HCL 50 MG PO TABS Oral Take 50 mg by mouth every 6 (six) hours as needed.       Benicar ; has not taken it today   BP 169/106  Pulse 116  Temp(Src) 98.5 F (36.9 C) (Oral)  Resp 22  Ht 5\' 3"  (1.6 m)  Wt 230 lb (104.327 kg)  BMI 40.74 kg/m2  SpO2 98%  Physical Exam  Nursing note and vitals reviewed. Constitutional: She appears well-developed and well-nourished.  HENT:  Head: Normocephalic and atraumatic.  Eyes: Conjunctivae are normal. Pupils are equal, round, and reactive to light.  Neck: Neck supple. No tracheal deviation present. No thyromegaly present.  Cardiovascular: Regular rhythm.   No murmur heard.      Mild tachycardia  Pulmonary/Chest: Effort normal and breath sounds normal.  Abdominal: Soft. Bowel sounds are normal. She exhibits no distension. There is no tenderness.       Obese  Genitourinary:       No flank tenderness  Musculoskeletal: Normal range of motion. She exhibits no edema and no tenderness.       All 4 extremities without redness swelling or tenderness, neurovascularly intact bilateral knees without effusion  Neurological: She is alert. Coordination normal.       Walks with cane unassisted  Skin: Skin is warm and dry. No rash noted.  Psychiatric:  Anxious appearing    ED Course  Procedures (including critical care time)  Labs Reviewed - No data to display No results found.   No diagnosis found.    MDM     tachycardia felt secondary to pain and/or anxiety   plan prescription for tramadol   referral Dr. Lajoyce Corners for knee pain   blood pressure rechecked one week She is encouraged to take her Benicar upon arrival home today Diagnosis #1 knee pain #2 chronic flank pain #3 hypertension      Doug Sou, MD 07/07/11 1254

## 2011-07-29 ENCOUNTER — Encounter (HOSPITAL_COMMUNITY): Payer: Self-pay | Admitting: Emergency Medicine

## 2011-07-29 ENCOUNTER — Emergency Department (HOSPITAL_COMMUNITY): Payer: Self-pay

## 2011-07-29 ENCOUNTER — Emergency Department (HOSPITAL_COMMUNITY)
Admission: EM | Admit: 2011-07-29 | Discharge: 2011-07-29 | Disposition: A | Discharge status: Home / Self Care | Payer: Self-pay | Attending: Emergency Medicine | Admitting: Emergency Medicine

## 2011-07-29 DIAGNOSIS — IMO0002 Reserved for concepts with insufficient information to code with codable children: Secondary | ICD-10-CM | POA: Insufficient documentation

## 2011-07-29 DIAGNOSIS — M25561 Pain in right knee: Secondary | ICD-10-CM

## 2011-07-29 DIAGNOSIS — M25569 Pain in unspecified knee: Secondary | ICD-10-CM | POA: Insufficient documentation

## 2011-07-29 DIAGNOSIS — Z79899 Other long term (current) drug therapy: Secondary | ICD-10-CM | POA: Insufficient documentation

## 2011-07-29 DIAGNOSIS — M1710 Unilateral primary osteoarthritis, unspecified knee: Secondary | ICD-10-CM | POA: Insufficient documentation

## 2011-07-29 MED ORDER — OXYCODONE-ACETAMINOPHEN 5-325 MG PO TABS: 2.0000 | ORAL_TABLET | ORAL | Status: AC | PRN

## 2011-07-29 MED ORDER — OXYCODONE-ACETAMINOPHEN 5-325 MG PO TABS
1.0000 | ORAL_TABLET | Freq: Once | ORAL | Status: AC
  Administered 2011-07-29: 1 via ORAL
  Filled 2011-07-29: qty 1

## 2011-07-29 MED ORDER — TRAMADOL HCL 50 MG PO TABS: ORAL_TABLET | ORAL | Status: DC

## 2011-07-29 MED ORDER — ONDANSETRON 4 MG PO TBDP
4.0000 mg | ORAL_TABLET | Freq: Once | ORAL | Status: AC
  Administered 2011-07-29: 4 mg via ORAL
  Filled 2011-07-29: qty 1

## 2011-07-29 NOTE — ED Provider Notes (Signed)
History     CSN: [id number]  Arrival date & time 07/29/11  1606   First MD Initiated Contact with Patient 07/29/11 1828      Chief Complaint  Patient presents with  . Knee Pain    (Consider location/radiation/quality/duration/timing/severity/associated sxs/prior treatment) HPI Hx from pt. 51yo F who presents with L knee pain. States that she's been told in the past that she has severe arthritis to the R knee and she likely needs a TKA on that side; R knee has been painful for over a year. She began to have pain in the left knee about 3-4 weeks ago which has been severe. States pain is described as throbbing in nature and radiates up her thigh. Worse with ambulation, improves with rest. Has not noted any swelling, redness, distal numbness/weakness. She has been using tramadol at home which is somewhat helpful, along with ice. Review of previous chart indicates that she was seen for the same several weeks ago at [facility] and instructed to f/u with ortho. States she has been unable to secure an appt with them as of yet due to insurance issues.  Past Medical History  Diagnosis Date  . Endometriosis   . Hiatal hernia   . Fibromyalgia   . Ovarian cyst     Past Surgical History  Procedure Date  . Tonsillectomy     History reviewed. No pertinent family history.  History  Substance Use Topics  . Smoking status: Former Games developer  . Smokeless tobacco: Not on file  . Alcohol Use: No    OB History    Grav Para Term Preterm Abortions TAB SAB Ect Mult Living                  Review of Systems  Constitutional: Negative for fever, chills, activity change and appetite change.  Musculoskeletal: Positive for arthralgias and gait problem.  Skin: Negative for color change and rash.    Allergies  Morphine and related and Rocephin  Home Medications   Current Outpatient Rx  Name Route Sig Dispense Refill  . CARISOPRODOL 350 MG PO TABS Oral Take 350 mg by mouth 4 (four) times daily  as needed. For pain.    Marland Kitchen NAPROXEN SODIUM 220 MG PO TABS Oral Take 220 mg by mouth 2 (two) times daily as needed. For pain.    Marland Kitchen TRAMADOL HCL 50 MG PO TABS Oral Take 100 mg by mouth every 4 (four) hours as needed. For pain.      BP 143/94  Pulse 114  Temp(Src) 98.2 F (36.8 C) (Oral)  Resp 22  Ht 5' 3.5" (1.613 m)  Wt 230 lb (104.327 kg)  BMI 40.10 kg/m2  SpO2 100%  Physical Exam  Nursing note and vitals reviewed. Constitutional: She appears well-developed and well-nourished. No distress.  HENT:  Head: Normocephalic and atraumatic.  Neck: Normal range of motion.  Cardiovascular: Normal rate.   Pulmonary/Chest: Effort normal.  Musculoskeletal: Normal range of motion.       Tender to palp along medial jt line of L knee. Knee is not edematous; no gross effusion. No crepitus or deformity. FROM although full extension causes pain. NVI distally. Pt ambulatory with a cane unassisted.  Neurological: She is alert.  Skin: Skin is warm and dry. She is not diaphoretic.  Psychiatric: She has a normal mood and affect.    ED Course  Procedures (including critical care time)  Labs Reviewed - No data to display Dg Knee Complete 4 Views Left  07/29/2011  *RADIOLOGY REPORT*  Clinical Data: Severe left knee pain.  No known injuries.  LEFT KNEE - COMPLETE 4+ VIEW 07/29/2011:  Comparison: None.  Findings: No evidence of acute, subacute, or healed fractures. Moderate medial and patellofemoral compartment joint space narrowing with associated hypertrophic spurring.  Lateral compartment joint space well preserved with associated spurring. No evidence of a significant joint effusion.  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  Original Report Authenticated By: Arnell Sieving, M.D.     1. Right knee pain       MDM  Pt with known severe OA to R knee presents with pain to L knee. Seen for the same several weeks ago. Xray taken today,  which I personally reviewed - tricompartment OA worst along medial jt line which corresponds with pt's pain. Explained to her the importance of f/u with ortho for this as jt injections will likely give her better long term pain relief than PO meds. Small rx for pain meds written. Return precautions discussed.        Grant Fontana, [place] 07/30/11 1338

## 2011-07-29 NOTE — Discharge Instructions (Signed)
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 Take the pain medication as needed.  RESOURCE GUIDE  Dental Problems  Patients with Medicaid: [facility] [phone]                                           [phone] [address] [employer] [address] Phone:  [phone]                                                  Phone:  [phone]  If unable to pay or uninsured, contact:  [facility] or [facility] to become qualified for the adult dental clinic.  Chronic Pain Problems Contact [facility]  [phone] Patients need to be referred by their primary care doctor.  Insufficient Money for Medicine Contact [employer] Way:  call "211" or [facility] 336-639-7797.  No Primary Care Doctor Call [facility]  [phone] Other agencies that provide inexpensive medical care    [facility]  [phone]    [facility]  [phone]    [facility]  [phone]    [facility]  [phone]    [facility]  [phone]    [facility]  [phone]  Psychological Services [facility]  [phone] [facility]  [phone] [facility]   [phone] (emergency services 802-725-1399)  Substance Abuse Resources [facility]  [phone] [facility] [phone] [facility] [phone] Floydene Flock [phone] Residential & Outpatient Substance Abuse Program  [phone]  Abuse/Neglect [facility] Child Abuse Hotline (445)085-8852 [facility] Child Abuse Hotline 256-561-5500 (After Hours)  Emergency Shelter [facility] (848)429-4689  Maternity Homes [facility] [place] [facility] [phone] [facility] 6828645207  MRSA Hotline #:   (401)140-0412    [facility]  Resources  [facility] [place]     [employer]                          [facility] Dept. [address] [place]                       [address]      [address]  [place]                                                Cristobal Goldmann Phone:  [phone]  Phone:  [phone]                 Phone:  [phone]  [facility] Phone:  [phone]  [facility] Child Abuse Hotline [phone] (After Hours)    Knee Pain The knee is the complex joint between your thigh and your lower leg. It is made up of bones, tendons, ligaments, and cartilage. The bones that make up the knee are:  The femur in the thigh.   The tibia and fibula in the lower leg.   The patella or kneecap riding in the groove on the lower femur.  CAUSES  Knee pain is a common complaint with many causes. A few of these causes are:  Injury, such as:   A ruptured ligament or tendon injury.   Torn cartilage.   Medical conditions, such as:   Gout   Arthritis   Infections   Overuse, over training or overdoing a physical activity.  Knee pain can be minor or severe. Knee pain can accompany debilitating injury. Minor knee problems often respond well to self-care measures or get well on their own. More serious injuries may need medical intervention or even surgery. SYMPTOMS The knee is complex. Symptoms of knee problems can vary widely. Some of the problems are:  Pain with movement and weight bearing.   Swelling and tenderness.   Buckling of the knee.   Inability to straighten or extend your knee.   Your knee locks and you cannot straighten it.   Warmth and redness with pain and fever.   Deformity or dislocation of the kneecap.  DIAGNOSIS  Determining what is wrong may be very straight forward such as when there is an injury. It can also be challenging because of the  complexity of the knee. Tests to make a diagnosis may include:  Your caregiver taking a history and doing a physical exam.   Routine X-rays can be used to rule out other problems. X-rays will not reveal a cartilage tear. Some injuries of the knee can be diagnosed by:   Arthroscopy a surgical technique by which a small video camera is inserted through tiny incisions on the sides of the knee. This procedure is used to examine and repair internal knee joint problems. Tiny instruments can be used during arthroscopy to repair the torn knee cartilage (meniscus).   Arthrography is a radiology technique. A contrast liquid is directly injected into the knee joint. Internal structures of the knee joint then become visible on X-ray film.   An MRI scan is a non x-ray radiology procedure in which magnetic fields and a computer produce two- or three-dimensional images of the inside of the knee. Cartilage tears are often visible using an MRI scanner. MRI scans have largely replaced arthrography in diagnosing cartilage tears of the knee.   Blood work.   Examination of the fluid that helps to lubricate the knee joint (synovial fluid). This is done by taking a sample out using a needle and a syringe.  TREATMENT The treatment of knee problems depends on the cause. Some of these treatments are:  Depending on the injury, proper casting, splinting, surgery or physical therapy care will be needed.   Give yourself adequate recovery time. Do not overuse your joints. If you begin to get sore during workout routines, back off. Slow down or do fewer repetitions.   For repetitive activities such as cycling or running, maintain your strength and nutrition.   Alternate muscle groups. For example  if you are a weight lifter, work the upper body on one day and the lower body the next.   Either tight or weak muscles do not give the proper support for your knee. Tight or weak muscles do not absorb the stress placed on the  knee joint. Keep the muscles surrounding the knee strong.   Take care of mechanical problems.   If you have flat feet, orthotics or special shoes may help. See your caregiver if you need help.   Arch supports, sometimes with wedges on the inner or outer aspect of the heel, can help. These can shift pressure away from the side of the knee most bothered by osteoarthritis.   A brace called an "unloader" brace also may be used to help ease the pressure on the most arthritic side of the knee.   If your caregiver has prescribed crutches, braces, wraps or ice, use as directed. The acronym for this is PRICE. This means protection, rest, ice, compression and elevation.   Nonsteroidal anti-inflammatory drugs (NSAID's), can help relieve pain. But if taken immediately after an injury, they may actually increase swelling. Take NSAID's with food in your stomach. Stop them if you develop stomach problems. Do not take these if you have a history of ulcers, stomach pain or bleeding from the bowel. Do not take without your caregiver's approval if you have problems with fluid retention, heart failure, or kidney problems.   For ongoing knee problems, physical therapy may be helpful.   Glucosamine and chondroitin are over-the-counter dietary supplements. Both may help relieve the pain of osteoarthritis in the knee. These medicines are different from the usual anti-inflammatory drugs. Glucosamine may decrease the rate of cartilage destruction.   Injections of a corticosteroid drug into your knee joint may help reduce the symptoms of an arthritis flare-up. They may provide pain relief that lasts a few months. You may have to wait a few months between injections. The injections do have a small increased risk of infection, water retention and elevated blood sugar levels.   Hyaluronic acid injected into damaged joints may ease pain and provide lubrication. These injections may work by reducing inflammation. A series of  shots may give relief for as long as 6 months.   Topical painkillers. Applying certain ointments to your skin may help relieve the pain and stiffness of osteoarthritis. Ask your pharmacist for suggestions. Many over the-counter products are approved for temporary relief of arthritis pain.   In some countries, doctors often prescribe topical NSAID's for relief of chronic conditions such as arthritis and tendinitis. A review of treatment with NSAID creams found that they worked as well as oral medications but without the serious side effects.  PREVENTION  Maintain a healthy weight. Extra pounds put more strain on your joints.   Get strong, stay limber. Weak muscles are a common cause of knee injuries. Stretching is important. Include flexibility exercises in your workouts.   Be smart about exercise. If you have osteoarthritis, chronic knee pain or recurring injuries, you may need to change the way you exercise. This does not mean you have to stop being active. If your knees ache after jogging or playing basketball, consider switching to swimming, water aerobics or other low-impact activities, at least for a few days a week. Sometimes limiting high-impact activities will provide relief.   Make sure your shoes fit well. Choose footwear that is right for your sport.   Protect your knees. Use the proper gear for knee-sensitive activities. Use kneepads  when playing volleyball or laying carpet. Buckle your seat belt every time you drive. Most shattered kneecaps occur in car accidents.   Rest when you are tired.  SEEK MEDICAL CARE IF:  You have knee pain that is continual and does not seem to be getting better.  SEEK IMMEDIATE MEDICAL CARE IF:  Your knee joint feels hot to the touch and you have a high fever. MAKE SURE YOU:   Understand these instructions.   Will watch your condition.   Will get help right away if you are not doing well or get worse.  Document Released: 12/23/2006 Document  Revised: 02/14/2011 Document Reviewed: 12/23/2006 [facility] Patient Information 2012 [place], [place].

## 2011-08-01 NOTE — ED Provider Notes (Signed)
Medical screening examination/treatment/procedure(s) were performed by non-physician practitioner and as supervising physician I was immediately available for consultation/collaboration.   Nishawn Rotan, MD 08/01/11 0701 

## 2012-02-28 ENCOUNTER — Encounter (HOSPITAL_BASED_OUTPATIENT_CLINIC_OR_DEPARTMENT_OTHER): Payer: Self-pay | Admitting: Family Medicine

## 2012-02-28 ENCOUNTER — Emergency Department (HOSPITAL_BASED_OUTPATIENT_CLINIC_OR_DEPARTMENT_OTHER): Payer: Self-pay

## 2012-02-28 ENCOUNTER — Emergency Department (HOSPITAL_BASED_OUTPATIENT_CLINIC_OR_DEPARTMENT_OTHER)
Admission: EM | Admit: 2012-02-28 | Discharge: 2012-02-28 | Disposition: A | Discharge status: Home / Self Care | Payer: Self-pay | Attending: Emergency Medicine | Admitting: Emergency Medicine

## 2012-02-28 DIAGNOSIS — Z8719 Personal history of other diseases of the digestive system: Secondary | ICD-10-CM | POA: Insufficient documentation

## 2012-02-28 DIAGNOSIS — Z8742 Personal history of other diseases of the female genital tract: Secondary | ICD-10-CM | POA: Insufficient documentation

## 2012-02-28 DIAGNOSIS — Z87891 Personal history of nicotine dependence: Secondary | ICD-10-CM | POA: Insufficient documentation

## 2012-02-28 DIAGNOSIS — D219 Benign neoplasm of connective and other soft tissue, unspecified: Secondary | ICD-10-CM | POA: Insufficient documentation

## 2012-02-28 DIAGNOSIS — R300 Dysuria: Secondary | ICD-10-CM | POA: Insufficient documentation

## 2012-02-28 DIAGNOSIS — Z791 Long term (current) use of non-steroidal anti-inflammatories (NSAID): Secondary | ICD-10-CM | POA: Insufficient documentation

## 2012-02-28 DIAGNOSIS — R350 Frequency of micturition: Secondary | ICD-10-CM | POA: Insufficient documentation

## 2012-02-28 DIAGNOSIS — R3915 Urgency of urination: Secondary | ICD-10-CM | POA: Insufficient documentation

## 2012-02-28 DIAGNOSIS — Z79899 Other long term (current) drug therapy: Secondary | ICD-10-CM | POA: Insufficient documentation

## 2012-02-28 LAB — CBC WITH DIFFERENTIAL/PLATELET
Basophils Absolute: 0 10*3/uL (ref 0.0–0.1)
Basophils Relative: 0 % (ref 0–1)
Eosinophils Absolute: 0 10*3/uL (ref 0.0–0.7)
Eosinophils Relative: 0 % (ref 0–5)
Lymphs Abs: 1.8 10*3/uL (ref 0.7–4.0)
MCH: 29.8 pg (ref 26.0–34.0)
MCHC: 34.2 g/dL (ref 30.0–36.0)
MCV: 87.2 fL (ref 78.0–100.0)
Platelets: 405 10*3/uL — ABNORMAL HIGH (ref 150–400)
RDW: 14 % (ref 11.5–15.5)

## 2012-02-28 LAB — COMPREHENSIVE METABOLIC PANEL
ALT: 21 U/L (ref 0–35)
Calcium: 9.7 mg/dL (ref 8.4–10.5)
GFR calc Af Amer: >90 mL/min (ref >90)
Glucose, Bld: 120 mg/dL — ABNORMAL HIGH (ref 70–99)
Sodium: 143 mEq/L (ref 135–145)
Total Protein: 8 g/dL (ref 6.0–8.3)

## 2012-02-28 LAB — URINALYSIS, ROUTINE W REFLEX MICROSCOPIC
Bilirubin Urine: NEGATIVE
Leukocytes, UA: NEGATIVE
Nitrite: NEGATIVE
Specific Gravity, Urine: 1.027 (ref 1.005–1.030)
pH: 5 (ref 5.0–8.0)

## 2012-02-28 LAB — WET PREP, GENITAL: Trich, Wet Prep: NONE SEEN

## 2012-02-28 MED ORDER — HYDROCODONE-ACETAMINOPHEN 5-325 MG PO TABS: 1.0000 | ORAL_TABLET | ORAL | Status: DC | PRN

## 2012-02-28 MED ORDER — ONDANSETRON HCL 4 MG/2ML IJ SOLN
4.0000 mg | Freq: Once | INTRAMUSCULAR | Status: AC
  Administered 2012-02-28: 4 mg via INTRAVENOUS
  Filled 2012-02-28: qty 2

## 2012-02-28 MED ORDER — SODIUM CHLORIDE 0.9 % IV BOLUS (SEPSIS)
1000.0000 mL | Freq: Once | INTRAVENOUS | Status: AC
  Administered 2012-02-28: 1000 mL via INTRAVENOUS

## 2012-02-28 MED ORDER — HYDROMORPHONE HCL PF 1 MG/ML IJ SOLN
1.0000 mg | Freq: Once | INTRAMUSCULAR | Status: AC
  Administered 2012-02-28: 1 mg via INTRAVENOUS
  Filled 2012-02-28: qty 1

## 2012-02-28 NOTE — ED Provider Notes (Signed)
History     CSN: [id number]  Arrival date & time 02/28/12  1237   First MD Initiated Contact with Patient 02/28/12 1300      No chief complaint on file.   (Consider location/radiation/quality/duration/timing/severity/associated sxs/prior treatment) Patient is a 51 y.o. female presenting with abdominal pain. The history is provided by the patient.  Abdominal Pain The primary symptoms of the illness include abdominal pain and dysuria. The primary symptoms of the illness do not include fever, shortness of breath, nausea, vomiting, diarrhea, vaginal discharge or vaginal bleeding. The current episode started 2 days ago. The onset of the illness was gradual. The problem has not changed since onset. The abdominal pain is located in the LLQ. The abdominal pain radiates to the left flank. The severity of the abdominal pain is 10/10. Relieved by: urinating and tramadol also helps. The abdominal pain is exacerbated by movement and certain positions.  The dysuria began 2 days ago. The discomfort is moderate. She is not currently sexually active. The dysuria is associated with frequency and urgency. The dysuria is not associated with discharge or vaginal pain.  The patient has not had a change in bowel habit. Additional symptoms associated with the illness include urgency and frequency. Symptoms associated with the illness do not include anorexia or constipation. Associated medical issues comments: endometriosis.    Past Medical History  Diagnosis Date  . Endometriosis   . Hiatal hernia   . Fibromyalgia   . Ovarian cyst     Past Surgical History  Procedure Date  . Tonsillectomy     No family history on file.  History  Substance Use Topics  . Smoking status: Former Games developer  . Smokeless tobacco: Not on file  . Alcohol Use: No    OB History    Grav Para Term Preterm Abortions TAB SAB Ect Mult Living                  Review of Systems  Constitutional: Negative for fever.   Respiratory: Negative for shortness of breath.   Gastrointestinal: Positive for abdominal pain. Negative for nausea, vomiting, diarrhea, constipation and anorexia.  Genitourinary: Positive for dysuria, urgency and frequency. Negative for vaginal bleeding, vaginal discharge and vaginal pain.  All other systems reviewed and are negative.    Allergies  Morphine and related and Rocephin  Home Medications   Current Outpatient Rx  Name  Route  Sig  Dispense  Refill  . CARISOPRODOL 350 MG PO TABS   Oral   Take 350 mg by mouth 4 (four) times daily as needed. For pain.         Marland Kitchen NAPROXEN SODIUM 220 MG PO TABS   Oral   Take 220 mg by mouth 2 (two) times daily as needed. For pain.         Marland Kitchen TRAMADOL HCL 50 MG PO TABS   Oral   Take 100 mg by mouth every 4 (four) hours as needed. For pain.         Marland Kitchen TRAMADOL HCL 50 MG PO TABS      Take 1-2 tablets (50-100 mg) by mouth every 6 hours as needed for pain.   30 tablet   0     There were no vitals taken for this visit.  Physical Exam  Nursing note and vitals reviewed. Constitutional: She is oriented to person, place, and time. She appears well-developed and well-nourished. No distress.  HENT:  Head: Normocephalic and atraumatic.  Mouth/Throat: Oropharynx is clear and  moist.  Eyes: Conjunctivae normal and EOM are normal. Pupils are equal, round, and reactive to light.  Neck: Normal range of motion. Neck supple.  Cardiovascular: Normal rate, regular rhythm and intact distal pulses.   No murmur heard. Pulmonary/Chest: Effort normal and breath sounds normal. No respiratory distress. She has no wheezes. She has no rales.  Abdominal: Soft. Normal appearance. She exhibits no distension. There is tenderness in the left lower quadrant. There is guarding and CVA tenderness. There is no rebound.  Genitourinary: Vagina normal and uterus normal. Cervix exhibits no motion tenderness, no discharge and no friability. Right adnexum displays no  mass, no tenderness and no fullness. Left adnexum displays tenderness. Left adnexum displays no mass and no fullness. No tenderness around the vagina. No vaginal discharge found.  Musculoskeletal: Normal range of motion. She exhibits no edema and no tenderness.  Neurological: She is alert and oriented to person, place, and time.  Skin: Skin is warm and dry. No rash noted. No erythema.  Psychiatric: She has a normal mood and affect. Her behavior is normal.    ED Course  Procedures (including critical care time)  Labs Reviewed  CBC WITH DIFFERENTIAL - Abnormal; Notable for the following:    Platelets 405 (*)     All other components within normal limits  COMPREHENSIVE METABOLIC PANEL - Abnormal; Notable for the following:    Glucose, Bld 120 (*)     Total Bilirubin 0.1 (*)     GFR calc non Af Amer 83 (*)     All other components within normal limits  URINALYSIS, ROUTINE W REFLEX MICROSCOPIC - Abnormal; Notable for the following:    APPearance CLOUDY (*)     Ketones, ur 15 (*)     All other components within normal limits   No results found.   No diagnosis found.    MDM   Patient is here complaining of 2 days of abdominal pain that started in the left lower quadrant and radiates to her left flank. She is tachycardic and uncomfortable appearing on exam. She states she has a past history of endometriosis but denies having any vaginal bleeding or periods for the last 4 months. On exam she has significant pain in her left lower quadrant and left flank. She also is complaining of urinary symptoms. Concern for UTI, pyelonephritis versus kidney stone. Low suspicion for pelvic pathology however could be a ruptured left ovarian cyst. Patient given IV pain control, fluids and Zofran. CBC, CMP, UA pending.  2:36 PM Labs and are within normal limits. On pelvic exam patient has significant tenderness in the left adnexa. She does have a prior history of ovarian cyst. Ultrasound ordered for  further evaluation. Patient feeling significant better after IV pain meds  3:26 PM U/s pending.    Gwyneth Sprout, MD 02/28/12 [phone]

## 2012-02-28 NOTE — ED Notes (Signed)
Pt c/o urinary frequency, pressure to lower abdomen and radiating to back x 2 days. Pt sts she is out of tramadol for pain.

## 2012-02-28 NOTE — ED Provider Notes (Signed)
History     CSN: [id number]  Arrival date & time 02/28/12  1237   First MD Initiated Contact with Patient 02/28/12 1300      Chief Complaint  Patient presents with  . Abdominal Pain    (Consider location/radiation/quality/duration/timing/severity/associated sxs/prior treatment) HPI  Past Medical History  Diagnosis Date  . Endometriosis   . Hiatal hernia   . Fibromyalgia   . Ovarian cyst     Past Surgical History  Procedure Date  . Tonsillectomy     No family history on file.  History  Substance Use Topics  . Smoking status: Former Games developer  . Smokeless tobacco: Not on file  . Alcohol Use: No    OB History    Grav Para Term Preterm Abortions TAB SAB Ect Mult Living                  Review of Systems  Allergies  Morphine and related and Rocephin  Home Medications   Current Outpatient Rx  Name  Route  Sig  Dispense  Refill  . CARISOPRODOL 350 MG PO TABS   Oral   Take 350 mg by mouth 4 (four) times daily as needed. For pain.         Marland Kitchen HYDROCODONE-ACETAMINOPHEN 5-325 MG PO TABS   Oral   Take 1 tablet by mouth every 4 (four) hours as needed for pain.   15 tablet   0   . NAPROXEN SODIUM 220 MG PO TABS   Oral   Take 220 mg by mouth 2 (two) times daily as needed. For pain.         Marland Kitchen TRAMADOL HCL 50 MG PO TABS   Oral   Take 100 mg by mouth every 4 (four) hours as needed. For pain.         Marland Kitchen TRAMADOL HCL 50 MG PO TABS      Take 1-2 tablets (50-100 mg) by mouth every 6 hours as needed for pain.   30 tablet   0     BP 163/99  Pulse 118  Temp 99 F (37.2 C) (Oral)  Resp 18  SpO2 100%  Physical Exam  ED Course  Procedures (including critical care time)  Labs Reviewed  CBC WITH DIFFERENTIAL - Abnormal; Notable for the following:    Platelets 405 (*)     All other components within normal limits  COMPREHENSIVE METABOLIC PANEL - Abnormal; Notable for the following:    Glucose, Bld 120 (*)     Total Bilirubin 0.1 (*)     GFR calc  non Af Amer 83 (*)     All other components within normal limits  URINALYSIS, ROUTINE W REFLEX MICROSCOPIC - Abnormal; Notable for the following:    APPearance CLOUDY (*)     Ketones, ur 15 (*)     All other components within normal limits  WET PREP, GENITAL - Abnormal; Notable for the following:    Clue Cells Wet Prep HPF POC RARE (*)     WBC, Wet Prep HPF POC RARE (*)     All other components within normal limits  GC/CHLAMYDIA PROBE AMP   US Transvaginal Non-ob  02/28/2012  *RADIOLOGY REPORT*  Clinical Data: Pelvic pain.  Increased urinary frequency.  History of endometriosis and ovarian cyst.  Prior appendectomy.  TRANSABDOMINAL AND TRANSVAGINAL ULTRASOUND OF PELVIS Technique:  Both transabdominal and transvaginal ultrasound examinations of the pelvis were performed. Transabdominal technique was performed for global imaging of the pelvis including uterus,  ovaries, adnexal regions, and pelvic cul-de-sac.  It was necessary to proceed with endovaginal exam following the transabdominal exam to visualize the uterus, endometrium and adnexa.  Comparison:  03/02/2008.  Findings:  Uterus: 7.8 x 4.0 x 5 cm.  Heterogeneous containing two fibroids (2.2 cm posterior body and 1.4 cm left posterior body.  Nabothian cyst incidentally noted.  Endometrium: 4 mm.  Right ovary:  Not visualized.  Left ovary: Not visualized.  Other findings: No free fluid  IMPRESSION: Ovaries not visualized.  Small fibroids as discussed above.   Original Report Authenticated By: Lacy Duverney, M.D.    US Pelvis Complete  02/28/2012  *RADIOLOGY REPORT*  Clinical Data: Pelvic pain.  Increased urinary frequency.  History of endometriosis and ovarian cyst.  Prior appendectomy.  TRANSABDOMINAL AND TRANSVAGINAL ULTRASOUND OF PELVIS Technique:  Both transabdominal and transvaginal ultrasound examinations of the pelvis were performed. Transabdominal technique was performed for global imaging of the pelvis including uterus, ovaries, adnexal  regions, and pelvic cul-de-sac.  It was necessary to proceed with endovaginal exam following the transabdominal exam to visualize the uterus, endometrium and adnexa.  Comparison:  03/02/2008.  Findings:  Uterus: 7.8 x 4.0 x 5 cm.  Heterogeneous containing two fibroids (2.2 cm posterior body and 1.4 cm left posterior body.  Nabothian cyst incidentally noted.  Endometrium: 4 mm.  Right ovary:  Not visualized.  Left ovary: Not visualized.  Other findings: No free fluid  IMPRESSION: Ovaries not visualized.  Small fibroids as discussed above.   Original Report Authenticated By: Lacy Duverney, M.D.      1. Fibroids       MDM  Discussed results with pt:pt is okay to follow up:pt given vicodin for pain        Teressa Lower, NP 02/28/12 1547

## 2012-02-29 NOTE — ED Provider Notes (Signed)
Medical screening examination/treatment/procedure(s) were conducted as a shared visit with non-physician practitioner(s) and myself.  I personally evaluated the patient during the encounter   Gwyneth Sprout, MD 02/29/12 1544

## 2012-03-02 ENCOUNTER — Telehealth: Payer: Self-pay | Admitting: *Deleted

## 2012-03-02 NOTE — Telephone Encounter (Signed)
Pt was seen in the ED and dx with fibroids states that she needs an immediate  appt

## 2012-03-19 ENCOUNTER — Ambulatory Visit: Payer: Self-pay | Admitting: Internal Medicine

## 2012-04-20 ENCOUNTER — Emergency Department (HOSPITAL_BASED_OUTPATIENT_CLINIC_OR_DEPARTMENT_OTHER)
Admission: EM | Admit: 2012-04-20 | Discharge: 2012-04-20 | Disposition: A | Discharge status: Home / Self Care | Payer: Self-pay | Attending: Emergency Medicine | Admitting: Emergency Medicine

## 2012-04-20 ENCOUNTER — Encounter (HOSPITAL_BASED_OUTPATIENT_CLINIC_OR_DEPARTMENT_OTHER): Payer: Self-pay | Admitting: *Deleted

## 2012-04-20 DIAGNOSIS — Z87891 Personal history of nicotine dependence: Secondary | ICD-10-CM | POA: Insufficient documentation

## 2012-04-20 DIAGNOSIS — Z8719 Personal history of other diseases of the digestive system: Secondary | ICD-10-CM | POA: Insufficient documentation

## 2012-04-20 DIAGNOSIS — IMO0001 Reserved for inherently not codable concepts without codable children: Secondary | ICD-10-CM | POA: Insufficient documentation

## 2012-04-20 DIAGNOSIS — R102 Pelvic and perineal pain: Secondary | ICD-10-CM

## 2012-04-20 DIAGNOSIS — N949 Unspecified condition associated with female genital organs and menstrual cycle: Secondary | ICD-10-CM | POA: Insufficient documentation

## 2012-04-20 DIAGNOSIS — Z8742 Personal history of other diseases of the female genital tract: Secondary | ICD-10-CM | POA: Insufficient documentation

## 2012-04-20 DIAGNOSIS — R350 Frequency of micturition: Secondary | ICD-10-CM | POA: Insufficient documentation

## 2012-04-20 LAB — CBC WITH DIFFERENTIAL/PLATELET
Basophils Absolute: 0 10*3/uL (ref 0.0–0.1)
Basophils Relative: 0 % (ref 0–1)
Eosinophils Relative: 1 % (ref 0–5)
HCT: 43.9 % (ref 36.0–46.0)
MCH: 29.8 pg (ref 26.0–34.0)
MCHC: 34.4 g/dL (ref 30.0–36.0)
MCV: 86.6 fL (ref 78.0–100.0)
Monocytes Absolute: 0.7 10*3/uL (ref 0.1–1.0)
RDW: 13.6 % (ref 11.5–15.5)

## 2012-04-20 LAB — URINALYSIS, ROUTINE W REFLEX MICROSCOPIC
Glucose, UA: NEGATIVE mg/dL
Leukocytes, UA: NEGATIVE
Specific Gravity, Urine: 1.023 (ref 1.005–1.030)
Urobilinogen, UA: 0.2 mg/dL (ref 0.0–1.0)

## 2012-04-20 LAB — COMPREHENSIVE METABOLIC PANEL
AST: 20 U/L (ref 0–37)
Albumin: 4.1 g/dL (ref 3.5–5.2)
Calcium: 10.1 mg/dL (ref 8.4–10.5)
Creatinine, Ser: 0.6 mg/dL (ref 0.50–1.10)
GFR calc non Af Amer: >90 mL/min (ref >90)

## 2012-04-20 LAB — URINE MICROSCOPIC-ADD ON

## 2012-04-20 MED ORDER — KETOROLAC TROMETHAMINE 30 MG/ML IJ SOLN
30.0000 mg | Freq: Once | INTRAMUSCULAR | Status: AC
  Administered 2012-04-20: 30 mg via INTRAVENOUS
  Filled 2012-04-20: qty 1

## 2012-04-20 MED ORDER — OXYCODONE-ACETAMINOPHEN 7.5-325 MG PO TABS: 1.0000 | ORAL_TABLET | ORAL | Status: DC | PRN

## 2012-04-20 MED ORDER — OXYCODONE-ACETAMINOPHEN 5-325 MG PO TABS
1.0000 | ORAL_TABLET | Freq: Once | ORAL | Status: AC
  Administered 2012-04-20: 1 via ORAL
  Filled 2012-04-20 (×2): qty 1

## 2012-04-20 MED ORDER — SODIUM CHLORIDE 0.9 % IV SOLN
Freq: Once | INTRAVENOUS | Status: AC
  Administered 2012-04-20: 16:00:00 via INTRAVENOUS

## 2012-04-20 MED ORDER — ONDANSETRON HCL 4 MG/2ML IJ SOLN
4.0000 mg | Freq: Once | INTRAMUSCULAR | Status: AC
  Administered 2012-04-20: 4 mg via INTRAVENOUS
  Filled 2012-04-20: qty 2

## 2012-04-20 NOTE — ED Notes (Signed)
MD at bedside. 

## 2012-04-20 NOTE — ED Notes (Signed)
Pt d/c home by cab-directed to pharmacy to pick up meds at d/c

## 2012-04-20 NOTE — ED Provider Notes (Signed)
History    This chart was scribed for Dione Booze, MD by Donne Anon, ED Scribe. This patient was seen in room MH06/MH06 and the patient's care was started at 1502.   CSN: [id number]  Arrival date & time 04/20/12  1400   First MD Initiated Contact with Patient 04/20/12 1502      Chief Complaint  Patient presents with  . Abdominal Pain     The history is provided by the patient. No language interpreter was used.   Debra [name] is a 52 y.o. female brought in by ambulance, who presents to the Emergency Department complaining of gradual onset, moderate, intermittent, gradually worsening pain to her left super pubic area which radiates to her back and is described as throbbing. She reports she has a previous episode 2 months ago but never followed up with her PCP. She reports this episode feel similar to previous episodes and began 2 days ago, around the her menstrual cycle was supposed to start, with the worst pain being 10/10. She reports associated increased frequency. She denies constipation, diarrhea, nausea, emesis or any other pain. She has tried Ibuprofen and Tramadol with no relief. 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  Her PCP is [facility].  Past Medical History  Diagnosis Date  . Endometriosis   . Hiatal hernia   . Fibromyalgia   . Ovarian cyst     Past Surgical History  Procedure Laterality Date  . Tonsillectomy      No family history on file.  History  Substance Use Topics  . Smoking status: Former Games developer  . Smokeless tobacco: Not on file  . Alcohol Use: No    OB History   Grav Para Term Preterm Abortions TAB SAB Ect Mult Living                  Review of Systems  All other systems reviewed and are negative.    Allergies  Morphine and related and Rocephin  Home Medications   Current Outpatient Rx  Name  Route  Sig  Dispense  Refill  . carisoprodol (SOMA) 350 MG tablet   Oral   Take 350 mg by mouth 4 (four) times  daily as needed. For pain.         Marland Kitchen HYDROcodone-acetaminophen (NORCO/VICODIN) 5-325 MG per tablet   Oral   Take 1 tablet by mouth every 4 (four) hours as needed for pain.   15 tablet   0   . naproxen sodium (ANAPROX) 220 MG tablet   Oral   Take 220 mg by mouth 2 (two) times daily as needed. For pain.         . traMADol (ULTRAM) 50 MG tablet   Oral   Take 100 mg by mouth every 4 (four) hours as needed. For pain.         . traMADol (ULTRAM) 50 MG tablet      Take 1-2 tablets (50-100 mg) by mouth every 6 hours as needed for pain.   30 tablet   0     Triage Vitals; BP 166/95  Pulse 120  Temp(Src) 98.6 F (37 C) (Oral)  Resp 18  Ht 5' 3.5" (1.613 m)  Wt 230 lb (104.327 kg)  BMI 40.1 kg/m2  SpO2 97%  Physical Exam  Nursing note and vitals reviewed. Constitutional: She is oriented to person, place, and time. She appears well-developed and well-nourished. No distress.  HENT:  Head: Normocephalic and atraumatic.  Eyes: EOM  are normal.  Neck: Neck supple. No tracheal deviation present.  Cardiovascular: Normal rate.   Pulmonary/Chest: Effort normal. No respiratory distress.  Abdominal: There is no rebound and no guarding.  Moderate tenderness left lower quadrant. Mild tenderness left upper quadrant and left costovertebral angle.  Musculoskeletal: Normal range of motion.  Neurological: She is alert and oriented to person, place, and time.  Skin: Skin is warm and dry.  Psychiatric: She has a normal mood and affect. Her behavior is normal.    ED Course  Procedures (including critical care time) DIAGNOSTIC STUDIES: Oxygen Saturation is 97% on room air, adequate by my interpretation.    COORDINATION OF CARE: 3:11 PM Discussed treatment plan which includes pain medication and labs with pt at bedside and pt agreed to plan.    Results for orders placed during the hospital encounter of 04/20/12  CBC WITH DIFFERENTIAL      Result Value Range   WBC 10.7 (*) 4.0 - 10.5  K/uL   RBC 5.07  3.87 - 5.11 MIL/uL   Hemoglobin 15.1 (*) 12.0 - 15.0 g/dL   HCT 16.1  09.6 - 04.5 %   MCV 86.6  78.0 - 100.0 fL   MCH 29.8  26.0 - 34.0 pg   MCHC 34.4  30.0 - 36.0 g/dL   RDW 40.9  81.1 - 91.4 %   Platelets 408 (*) 150 - 400 K/uL   Neutrophils Relative 69  43 - 77 %   Neutro Abs 7.3  1.7 - 7.7 K/uL   Lymphocytes Relative 24  12 - 46 %   Lymphs Abs 2.5  0.7 - 4.0 K/uL   Monocytes Relative 7  3 - 12 %   Monocytes Absolute 0.7  0.1 - 1.0 K/uL   Eosinophils Relative 1  0 - 5 %   Eosinophils Absolute 0.1  0.0 - 0.7 K/uL   Basophils Relative 0  0 - 1 %   Basophils Absolute 0.0  0.0 - 0.1 K/uL  COMPREHENSIVE METABOLIC PANEL      Result Value Range   Sodium 142  135 - 145 mEq/L   Potassium 3.7  3.5 - 5.1 mEq/L   Chloride 104  96 - 112 mEq/L   CO2 25  19 - 32 mEq/L   Glucose, Bld 89  70 - 99 mg/dL   BUN 15  6 - 23 mg/dL   Creatinine, Ser 7.82  0.50 - 1.10 mg/dL   Calcium 95.6  8.4 - 21.3 mg/dL   Total Protein 8.0  6.0 - 8.3 g/dL   Albumin 4.1  3.5 - 5.2 g/dL   AST 20  0 - 37 U/L   ALT 26  0 - 35 U/L   Alkaline Phosphatase 119 (*) 39 - 117 U/L   Total Bilirubin 0.2 (*) 0.3 - 1.2 mg/dL   GFR calc non Af Amer >90  >90 mL/min   GFR calc Af Amer >90  >90 mL/min  URINALYSIS, ROUTINE W REFLEX MICROSCOPIC      Result Value Range   Color, Urine YELLOW  YELLOW   APPearance CLEAR  CLEAR   Specific Gravity, Urine 1.023  1.005 - 1.030   pH 6.0  5.0 - 8.0   Glucose, UA NEGATIVE  NEGATIVE mg/dL   Hgb urine dipstick TRACE (*) NEGATIVE   Bilirubin Urine NEGATIVE  NEGATIVE   Ketones, ur NEGATIVE  NEGATIVE mg/dL   Protein, ur NEGATIVE  NEGATIVE mg/dL   Urobilinogen, UA 0.2  0.0 - 1.0 mg/dL  Nitrite NEGATIVE  NEGATIVE   Leukocytes, UA NEGATIVE  NEGATIVE  URINE MICROSCOPIC-ADD ON      Result Value Range   Squamous Epithelial / LPF RARE  RARE   WBC, UA 0-2  <3 WBC/hpf   RBC / HPF 3-6  <3 RBC/hpf   Bacteria, UA RARE  RARE   Urine-Other MUCOUS PRESENT      1. Pelvic pain        MDM  Return to suprapubic pain. Old records are reviewed and she has multiple ED visits for similar complaints. She has had 5 CT scans of her abdomen and pelvis all of which are negative. She was seen 2 months ago and had an ultrasound which showed a uterine fibroid. I do not think that is actually what is causing her pain. She does give a history of endometriosis and I do believe that her pain is all related to perimenopausal syndrome with endometriosis. Laboratory workup will be done to see if there is any sign of anything more serious. If lab work is normal, imaging will not be repeated. She will be referred to [facility] hospital clinic for followup.  Laboratory workup is unremarkable. She got slight relief from initial dose of Percocet with better relief with the second dose. She'll be discharged with a prescription for Percocet 7.5-325 and referred to women's clinic for followup.  I personally performed the services described in this documentation, which was scribed in my presence. The recorded information has been reviewed and is accurate.           Dione Booze, MD 04/20/12 [phone]

## 2012-04-20 NOTE — ED Notes (Addendum)
Pt reports abdominal pain x 2 days. Hx of uterine fibroids. No nausea/vomiting/diarrhea. States it flairs up with period.

## 2012-05-07 ENCOUNTER — Encounter: Payer: Self-pay | Admitting: Obstetrics & Gynecology

## 2012-05-25 ENCOUNTER — Emergency Department (HOSPITAL_BASED_OUTPATIENT_CLINIC_OR_DEPARTMENT_OTHER)
Admission: EM | Admit: 2012-05-25 | Discharge: 2012-05-25 | Disposition: A | Discharge status: Home / Self Care | Payer: Self-pay | Attending: Emergency Medicine | Admitting: Emergency Medicine

## 2012-05-25 ENCOUNTER — Encounter (HOSPITAL_BASED_OUTPATIENT_CLINIC_OR_DEPARTMENT_OTHER): Payer: Self-pay | Admitting: Emergency Medicine

## 2012-05-25 DIAGNOSIS — M129 Arthropathy, unspecified: Secondary | ICD-10-CM | POA: Insufficient documentation

## 2012-05-25 DIAGNOSIS — Z3202 Encounter for pregnancy test, result negative: Secondary | ICD-10-CM | POA: Insufficient documentation

## 2012-05-25 DIAGNOSIS — Z9089 Acquired absence of other organs: Secondary | ICD-10-CM | POA: Insufficient documentation

## 2012-05-25 DIAGNOSIS — R142 Eructation: Secondary | ICD-10-CM | POA: Insufficient documentation

## 2012-05-25 DIAGNOSIS — R350 Frequency of micturition: Secondary | ICD-10-CM | POA: Insufficient documentation

## 2012-05-25 DIAGNOSIS — I10 Essential (primary) hypertension: Secondary | ICD-10-CM | POA: Insufficient documentation

## 2012-05-25 DIAGNOSIS — IMO0001 Reserved for inherently not codable concepts without codable children: Secondary | ICD-10-CM | POA: Insufficient documentation

## 2012-05-25 DIAGNOSIS — Z8719 Personal history of other diseases of the digestive system: Secondary | ICD-10-CM | POA: Insufficient documentation

## 2012-05-25 DIAGNOSIS — R141 Gas pain: Secondary | ICD-10-CM | POA: Insufficient documentation

## 2012-05-25 DIAGNOSIS — N949 Unspecified condition associated with female genital organs and menstrual cycle: Secondary | ICD-10-CM | POA: Insufficient documentation

## 2012-05-25 DIAGNOSIS — Z8742 Personal history of other diseases of the female genital tract: Secondary | ICD-10-CM | POA: Insufficient documentation

## 2012-05-25 DIAGNOSIS — Z79899 Other long term (current) drug therapy: Secondary | ICD-10-CM | POA: Insufficient documentation

## 2012-05-25 DIAGNOSIS — Z87891 Personal history of nicotine dependence: Secondary | ICD-10-CM | POA: Insufficient documentation

## 2012-05-25 LAB — URINE MICROSCOPIC-ADD ON

## 2012-05-25 LAB — URINALYSIS, ROUTINE W REFLEX MICROSCOPIC
Bilirubin Urine: NEGATIVE
Ketones, ur: NEGATIVE mg/dL
Nitrite: NEGATIVE
pH: 5 (ref 5.0–8.0)

## 2012-05-25 MED ORDER — HYDROMORPHONE HCL PF 1 MG/ML IJ SOLN
1.0000 mg | Freq: Once | INTRAMUSCULAR | Status: AC
  Administered 2012-05-25: 1 mg via INTRAMUSCULAR
  Filled 2012-05-25: qty 1

## 2012-05-25 MED ORDER — OXYCODONE-ACETAMINOPHEN 5-325 MG PO TABS: 2.0000 | ORAL_TABLET | ORAL | Status: DC | PRN

## 2012-05-25 NOTE — ED Notes (Signed)
LLQ pain x2 months.  Followup up with PMD per DC instructions but the tramadol is not always effective.  Sts she will get "certified" on March 27th and then she can go to see the "GI Doctor".

## 2012-05-25 NOTE — ED Notes (Signed)
Pt states pain is in LLQ and keeps her up at night. Pt states pain has been intermittent for the past couple of months and comes with her menstrual cycle. Pt also states current pain medication is not effective.

## 2012-05-25 NOTE — ED Provider Notes (Signed)
History    Scribed for Rolan Bucco, MD, the patient was seen in room MH11/MH11. This chart was scribed by Lewanda Rife, ED scribe. Patient's care was started at 1711   CSN: [id number]  Arrival date & time 05/25/12  1607   First MD Initiated Contact with Patient 05/25/12 1646      Chief Complaint  Patient presents with  . Abdominal Pain    (Consider location/radiation/quality/duration/timing/severity/associated sxs/prior treatment) HPI Debra [name] is a 52 y.o. female who presents to the Emergency Department complaining of constant, moderate left suprapubic abdominal pain radiating to left low back onset 1 week. Pt reports recurrent abdominal pain for past 2 months. Pt reports feeling "bloated", and frequency with urination. Pt denies fever, dysuria, diarrhea, emesis, and constipation. Pt reports she does not have Ob-gyn and did not follow up with referral from last visit to ED. Pt reports taking Ultram every 6 hours with no relief of symptoms. Pt reports hx of ovarian cyst and endometriosis with similar left sides abd pain.    Past Medical History  Diagnosis Date  . Endometriosis   . Hiatal hernia   . Fibromyalgia   . Ovarian cyst     Past Surgical History  Procedure Laterality Date  . Tonsillectomy      No family history on file.  History  Substance Use Topics  . Smoking status: Former Games developer  . Smokeless tobacco: Not on file  . Alcohol Use: No    OB History   Grav Para Term Preterm Abortions TAB SAB Ect Mult Living                  Review of Systems  Constitutional: Negative.   HENT: Negative.   Respiratory: Negative.   Cardiovascular: Negative.   Gastrointestinal: Positive for abdominal pain.  Musculoskeletal: Negative.   Skin: Negative.   Neurological: Negative.   Psychiatric/Behavioral: Negative.   All other systems reviewed and are negative.   A complete 10 system review of systems was obtained and all systems are negative except as noted in the  HPI and PMH.    Allergies  Morphine and related and Rocephin  Home Medications   Current Outpatient Rx  Name  Route  Sig  Dispense  Refill  . carisoprodol (SOMA) 350 MG tablet   Oral   Take 350 mg by mouth 4 (four) times daily as needed. For pain.         . traMADol (ULTRAM) 50 MG tablet   Oral   Take 100 mg by mouth every 4 (four) hours as needed. For pain.         Marland Kitchen HYDROcodone-acetaminophen (NORCO/VICODIN) 5-325 MG per tablet   Oral   Take 1 tablet by mouth every 4 (four) hours as needed for pain.   15 tablet   0   . naproxen sodium (ANAPROX) 220 MG tablet   Oral   Take 220 mg by mouth 2 (two) times daily as needed. For pain.         Marland Kitchen oxyCODONE-acetaminophen (PERCOCET) 5-325 MG per tablet   Oral   Take 2 tablets by mouth every 4 (four) hours as needed for pain.   15 tablet   0   . oxyCODONE-acetaminophen (PERCOCET) 7.5-325 MG per tablet   Oral   Take 1 tablet by mouth every 4 (four) hours as needed for pain.   20 tablet   0   . traMADol (ULTRAM) 50 MG tablet      Take 1-2 tablets (50-100  mg) by mouth every 6 hours as needed for pain.   30 tablet   0     BP 146/100  Pulse 104  Temp(Src) 98.6 F (37 C) (Oral)  Resp 16  Ht 5\' 3"  (1.6 m)  Wt 215 lb (97.523 kg)  BMI 38.09 kg/m2  SpO2 100%  Physical Exam  Nursing note and vitals reviewed. Constitutional: She is oriented to person, place, and time. She appears well-developed and well-nourished.  HENT:  Head: Normocephalic and atraumatic.  Eyes: Pupils are equal, round, and reactive to light.  Neck: Normal range of motion. Neck supple.  Cardiovascular: Normal rate, regular rhythm and normal heart sounds.   Pulmonary/Chest: Effort normal and breath sounds normal. No respiratory distress. She has no wheezes. She has no rales. She exhibits no tenderness.  Abdominal: Soft. Bowel sounds are normal. There is tenderness (left suprapubic moderate). There is no rebound, no guarding and no CVA tenderness.   Musculoskeletal: Normal range of motion. She exhibits no edema.  Lymphadenopathy:    She has no cervical adenopathy.  Neurological: She is alert and oriented to person, place, and time.  Skin: Skin is warm and dry. No rash noted.  Psychiatric: She has a normal mood and affect.    ED Course  Procedures (including critical care time) Medications  HYDROmorphone (DILAUDID) injection 1 mg (1 mg Intramuscular Given 05/25/12 1744)    Results for orders placed during the hospital encounter of 05/25/12  URINALYSIS, ROUTINE W REFLEX MICROSCOPIC      Result Value Range   Color, Urine YELLOW  YELLOW   APPearance CLOUDY (*) CLEAR   Specific Gravity, Urine 1.017  1.005 - 1.030   pH 5.0  5.0 - 8.0   Glucose, UA NEGATIVE  NEGATIVE mg/dL   Hgb urine dipstick SMALL (*) NEGATIVE   Bilirubin Urine NEGATIVE  NEGATIVE   Ketones, ur NEGATIVE  NEGATIVE mg/dL   Protein, ur NEGATIVE  NEGATIVE mg/dL   Urobilinogen, UA 0.2  0.0 - 1.0 mg/dL   Nitrite NEGATIVE  NEGATIVE   Leukocytes, UA SMALL (*) NEGATIVE  PREGNANCY, URINE      Result Value Range   Preg Test, Ur NEGATIVE  NEGATIVE  URINE MICROSCOPIC-ADD ON      Result Value Range   Squamous Epithelial / LPF FEW (*) RARE   WBC, UA 3-6  <3 WBC/hpf   RBC / HPF 3-6  <3 RBC/hpf   Bacteria, UA FEW (*) RARE   No results found.    1. Pelvic pain       MDM  Pt presents with left lower quadrant abdominal pain. She's had similar episodes in the past related to ovarian cyst and endometriosis. She's had several ED visits for the same type of pain. She had a recent ultrasound in December for this similar type pain and she previously has had multiple CT scans for this left-sided abdominal pain. She was given a referral to the faculty clinic at the [facility] but has not followed up. I will give her another referral for this and give her a short course of narcotic pain medicine however I advised her that she continue to get followup in a mechanic he  prescribing narcotic pain medicine from the emergency department. At this point I did not feel that further imaging is indicated. I feel her urine is more likely a contaminated specimen however was sent for culture.      I personally performed the services described in this documentation, which was scribed in  my presence.  The recorded information has been reviewed and considered.    Rolan Bucco, MD 05/25/12 1800

## 2012-05-27 ENCOUNTER — Inpatient Hospital Stay (HOSPITAL_COMMUNITY)
Admission: AD | Admit: 2012-05-27 | Discharge: 2012-05-27 | Discharge status: Against Medical Advice | Payer: Self-pay | Source: Ambulatory Visit | Attending: Family Medicine | Admitting: Family Medicine

## 2012-05-27 ENCOUNTER — Encounter: Payer: Self-pay | Admitting: Obstetrics & Gynecology

## 2012-05-27 ENCOUNTER — Ambulatory Visit (INDEPENDENT_AMBULATORY_CARE_PROVIDER_SITE_OTHER): Payer: Self-pay | Admitting: Obstetrics & Gynecology

## 2012-05-27 VITALS — BP 184/120 | HR 108 | Temp 97.9°F | Ht 63.0 in | Wt 218.9 lb

## 2012-05-27 DIAGNOSIS — N390 Urinary tract infection, site not specified: Secondary | ICD-10-CM | POA: Insufficient documentation

## 2012-05-27 DIAGNOSIS — R102 Pelvic and perineal pain: Secondary | ICD-10-CM

## 2012-05-27 DIAGNOSIS — I10 Essential (primary) hypertension: Secondary | ICD-10-CM

## 2012-05-27 LAB — URINE CULTURE

## 2012-05-27 MED ORDER — SULFAMETHOXAZOLE-TRIMETHOPRIM 800-160 MG PO TABS: 1.0000 | ORAL_TABLET | Freq: Two times a day (BID) | ORAL | Status: DC

## 2012-05-27 NOTE — Assessment & Plan Note (Signed)
Severe hypertension with repeat BP of 192/118. Patient on HCTZ at home which she has not been taking. Sent patient to MAU/ED for treatment of elevated BP. Instructed patient of importance of taking BP meds at home.

## 2012-05-27 NOTE — Assessment & Plan Note (Addendum)
Will treat underlying UTI first before addressing pelvic pain further. Patient to return after resolution of UTI to discuss pelvic pain. May benefit from trial of OCP's at that time, if pain persists after treatment of UTI.  GC/Chl and PAP smear obtained during visit.

## 2012-05-27 NOTE — MAU Note (Signed)
Patient was not in the lobby when called to triage. Admission representative states the patient told her she had to catch a bus and would not wait. Patient was not seen in MAU.

## 2012-05-27 NOTE — Progress Notes (Addendum)
52 yo G0 perimenopausal woman who presents to the GYN clinic for evaluation of pelvic pain.   Patient reports a history of pelvic pain due to endometriosis in the 1990's for which she had 3-4 laparoscopic procedures. Pain subsided after the last procedure.  Two years ago, she started having left lower abdominal pain, intermittent. Pain recurred two months ago. Described as intermittent, sharp, radiating to the back. Patient can't say what makes it worst. She thinks sex may make it better. Associated with dysuria, polyuria. No hematuria. No fevers or chills.  No constipation. Has daily soft bowel movements.  No history of recent STD's.   She was seen in the ED on 05/25/12 where she had UA and UCx. UCx positive for 100,000 E-coli for which she has not yet been treated. She was given percocet and tramadol to treat her pain.    Pertinent Gynecological History: Menses: irregular, goes up to 6 months without periods Bleeding: none Sexually transmitted diseases: GC/Chl negative on 02/28/12 Last pap: unknown date, normal  OB History: G0 Sexual history: last intercourse 2 months ago, 1 partner.  History of non concential sex 2 years ago.   Menstrual History: No LMP recorded. Patient is not currently having periods (Reason: Perimenopausal).    Past Medical History  Diagnosis Date  . Endometriosis   . Hiatal hernia   . Fibromyalgia   . Ovarian cyst   . Arthritis   . Hypertension     Past Surgical History  Procedure Laterality Date  . Tonsillectomy    . Appendectomy    . Laparoscopy      Family History  Problem Relation Age of Onset  . Kidney disease Mother   . Hypertension Mother     Social History:  reports that she has quit smoking. She has never used smokeless tobacco. She reports that she does not drink alcohol or use illicit drugs.  Allergies:  Allergies  Allergen Reactions  . Morphine And Related Hives and Rash  . Rocephin (Ceftriaxone Sodium In Dextrose) Hives and Rash      (Not in a hospital admission)  ROS Negative except per HPI Blood pressure 184/120, pulse 108, temperature 97.9 F (36.6 C), temperature source Oral, height 5\' 3"  (1.6 m), weight 218 lb 14.4 oz (99.292 kg). Physical Exam Physical Examination: General appearance - alert, well appearing, and in no distress Abdomen - soft, tender in the left lower quadrant, no rebound or guarding, no masses, positive left CVA tenderness Pelvic - normal external genitalia, vulva, vagina, cervix, cervical motion tenderness present   Transabdominal and Pelvic Ultrasound from 02/28/12: Findings:  Uterus: 7.8 x 4.0 x 5 cm. Heterogeneous containing two fibroids  (2.2 cm posterior body and 1.4 cm left posterior body. Nabothian  cyst incidentally noted.  Endometrium: 4 mm.  Right ovary: Not visualized.  Left ovary: Not visualized.  Other findings: No free fluid  CT abdomen 09/26/10:  Findings: Liver, spleen, pancreas, adrenal glands, and kidneys  normal. Gallbladder unremarkable by CT. No biliary ductal  dilation. Stomach and visualized large and small bowel  unremarkable. Abdominal aorta normal in caliber. No significant  lymphadenopathy. No free fluid. Visualized lung bases clear.  Appendix surgically absent. Visualized colon and small bowel  unremarkable. No free fluid. Uterus and adnexa normal for age. No  significant lymphadenopathy. Urinary bladder normal. No  significant change from priors.  Assessment/Plan: See problem list.   Debra [name] 05/27/2012, 4:43 PM   Attestation of Attending Supervision of Resident: Evaluation and management procedures were performed  by the [facility] Resident under my supervision.  I have seen and examined the patient, reviewed the resident's note and chart, and I agree with the management and plan.  Anibal Henderson, M.D. 05/28/2012 3:38 PM

## 2012-05-27 NOTE — Assessment & Plan Note (Addendum)
Documented E-coli UTI sensitive to bactrim, likely cause of recent abdominal and flank pain. Treat with 7 day course of bactrim.

## 2012-05-27 NOTE — Patient Instructions (Addendum)
Pelvic Pain Pelvic pain is pain below the belly button and located between your hips. Acute pain may last a few hours or days. Chronic pelvic pain may last weeks and months. The cause may be different for different types of pain. The pain may be dull or sharp, mild or severe and can interfere with your daily activities. Write down and tell your caregiver:   Exactly where the pain is located.  If it comes and goes or is there all the time.  When it happens (with sex, urination, bowel movement, etc.)  If the pain is related to your menstrual period or stress. Your caregiver will take a full history and do a complete physical exam and Pap test. CAUSES   Painful menstrual periods (dysmenorrhea).  Normal ovulation (Mittelschmertz) that occurs in the middle of the menstrual cycle every month.  The pelvic organs get engorged with blood just before the menstrual period (pelvic congestive syndrome).  Scar tissue from an infection or past surgery (pelvic adhesions).  Cancer of the female pelvic organs. When there is pain with cancer, it has been there for a long time.  The lining of the uterus (endometrium) abnormally grows in places like the pelvis and on the pelvic organs (endometriosis).  A form of endometriosis with the lining of the uterus present inside of the muscle tissue of the uterus (adenomyosis).  Fibroid tumor (noncancerous) in the uterus.  Bladder problems such as infection, bladder spasms of the muscle tissue of the bladder.  Intestinal problems (irritable bowel syndrome, colitis, an ulcer or gastrointestinal infection).  Polyps of the cervix or uterus.  Pregnancy in the tube (ectopic pregnancy).  The opening of the cervix is too small for the menstrual blood to flow through it (cervical stenosis).  Physical or sexual abuse (past or present).  Musculo-skeletal problems from poor posture, problems with the vertebrae of the lower back or the uterine pelvic muscles falling  (prolapse).  Psychological problems such as depression or stress.  IUD (intrauterine device) in the uterus. DIAGNOSIS  Tests to make a diagnosis depends on the type, location, severity and what causes the pain to occur. Tests that may be needed include:  Blood tests.  Urine tests  Ultrasound.  X-rays.  CT Scan.  MRI.  Laparoscopy.  Major surgery. TREATMENT  Treatment will depend on the cause of the pain, which includes:  Prescription or over-the-counter pain medication.  Antibiotics.  Birth control pills.  Hormone treatment.  Nerve blocking injections.  Physical therapy.  Antidepressants.  Counseling with a psychiatrist or psychologist.  Minor or major surgery. HOME CARE INSTRUCTIONS   Only take over-the-counter or prescription medicines for pain, discomfort or fever as directed by your caregiver.  Follow your caregiver's advice to treat your pain.  Rest.  Avoid sexual intercourse if it causes the pain.  Apply warm or cold compresses (which ever works best) to the pain area.  Do relaxation exercises such as yoga or meditation.  Try acupuncture.  Avoid stressful situations.  Try group therapy.  If the pain is because of a stomach/intestinal upset, drink clear liquids, eat a bland light food diet until the symptoms go away. SEEK MEDICAL CARE IF:   You need stronger prescription pain medication.  You develop pain with sexual intercourse.  You have pain with urination.  You develop a temperature of 102 F (38.9 C) with the pain.  You are still in pain after 4 hours of taking prescription medication for the pain.  You need depression medication.    Your IUD is causing pain and you want it removed. SEEK IMMEDIATE MEDICAL CARE IF:  You develop very severe pain or tenderness.  You faint, have chills, severe weakness or dehydration.  You develop heavy vaginal bleeding or passing solid tissue.  You develop a temperature of 102 F (38.9 C)  with the pain.  You have blood in the urine.  You are being physically or sexually abused.  You have uncontrolled vomiting and diarrhea.  You are depressed and afraid of harming yourself or someone else. Document Released: 04/04/2004 Document Revised: 05/20/2011 Document Reviewed: 12/31/2007 [facility] Patient Information 2013 [place], [place]. Hypertension As your heart beats, it forces blood through your arteries. This force is your blood pressure. If the pressure is too high, it is called hypertension (HTN) or high blood pressure. HTN is dangerous because you may have it and not know it. High blood pressure may mean that your heart has to work harder to pump blood. Your arteries may be narrow or stiff. The extra work puts you at risk for heart disease, stroke, and other problems.  Blood pressure consists of two numbers, a higher number over a lower, 110/72, for example. It is stated as "110 over 72." The ideal is below 120 for the top number (systolic) and under 80 for the bottom (diastolic). Write down your blood pressure today. You should pay close attention to your blood pressure if you have certain conditions such as:  Heart failure.  Prior heart attack.  Diabetes  Chronic kidney disease.  Prior stroke.  Multiple risk factors for heart disease. To see if you have HTN, your blood pressure should be measured while you are seated with your arm held at the level of the heart. It should be measured at least twice. A one-time elevated blood pressure reading (especially in the Emergency Department) does not mean that you need treatment. There may be conditions in which the blood pressure is different between your right and left arms. It is important to see your caregiver soon for a recheck. Most people have essential hypertension which means that there is not a specific cause. This type of high blood pressure may be lowered by changing lifestyle factors such  as:  Stress.  Smoking.  Lack of exercise.  Excessive weight.  Drug/tobacco/alcohol use.  Eating less salt. Most people do not have symptoms from high blood pressure until it has caused damage to the body. Effective treatment can often prevent, delay or reduce that damage. TREATMENT  When a cause has been identified, treatment for high blood pressure is directed at the cause. There are a large number of medications to treat HTN. These fall into several categories, and your caregiver will help you select the medicines that are best for you. Medications may have side effects. You should review side effects with your caregiver. If your blood pressure stays high after you have made lifestyle changes or started on medicines,   Your medication(s) may need to be changed.  Other problems may need to be addressed.  Be certain you understand your prescriptions, and know how and when to take your medicine.  Be sure to follow up with your caregiver within the time frame advised (usually within two weeks) to have your blood pressure rechecked and to review your medications.  If you are taking more than one medicine to lower your blood pressure, make sure you know how and at what times they should be taken. Taking two medicines at the same time can result in  blood pressure that is too low. SEEK IMMEDIATE MEDICAL CARE IF:  You develop a severe headache, blurred or changing vision, or confusion.  You have unusual weakness or numbness, or a faint feeling.  You have severe chest or abdominal pain, vomiting, or breathing problems. MAKE SURE YOU:   Understand these instructions.  Will watch your condition.  Will get help right away if you are not doing well or get worse. Document Released: 02/25/2005 Document Revised: 05/20/2011 Document Reviewed: 10/16/2007 [facility] Patient Information 2013 [place], [place].

## 2012-05-28 ENCOUNTER — Telehealth: Payer: Self-pay | Admitting: *Deleted

## 2012-05-28 NOTE — Telephone Encounter (Signed)
Patient called and left message that she would like some pain medicine. Per chart review I saw that patient was given an rx for 15 Percocet on Monday and the note from yesterdays visit stated that the uti was likely the cause of her pain and she should take antibiotics. I called her back but she was not home. I left a message with her husband to have [place] call her back.

## 2012-05-28 NOTE — ED Notes (Signed)
+   Urine Patient treated with Bactrim-sensitive to same-chart appended per protocol MD. 

## 2012-06-02 NOTE — Telephone Encounter (Signed)
Spoke with Debra [name].  She reports still being in pain.  She believes it may be a "fibroid tumor" or the UTI.  She has been on antibiotics since 05/28/12 and her symptoms do not seem to be resolving.  I advised her to make an appointment to come in and see one of our providers.  She stated several complications with transportation and finances.  I then advised her to report to [facility] if her pain becomes worse and again suggested she see one of our providers.  Patient voiced understanding and stated she would call back after the 1st of April if she is still having pain.

## 2012-06-03 ENCOUNTER — Telehealth: Payer: Self-pay | Admitting: Obstetrics and Gynecology

## 2012-06-03 NOTE — Telephone Encounter (Deleted)
Message copied by Toula Moos on Wed Jun 03, 2012  2:19 PM ------      Message from: Willodean Rosenthal      Created: Mon Jun 01, 2012 12:40 PM       Pt needs repeat PAP.  No endocervical cells on current PAP.            Thx,      clh-S  ------

## 2012-06-03 NOTE — Telephone Encounter (Addendum)
Message copied by Toula Moos on Wed Jun 03, 2012  2:32 PM ------  Called patient and notified of needing to repeat pap due to endocervical cells absent. Patient states that she will be moving out of town and that she will seek to repeat pap from the new town she will be moving to.       Message from: Willodean Rosenthal      Created: Mon Jun 01, 2012 12:40 PM       Pt needs repeat PAP.  No endocervical cells on current PAP.            Thx,      clh-S  ------

## 2012-06-24 ENCOUNTER — Ambulatory Visit: Payer: Self-pay | Admitting: Obstetrics & Gynecology

## 2012-08-09 ENCOUNTER — Encounter (HOSPITAL_BASED_OUTPATIENT_CLINIC_OR_DEPARTMENT_OTHER): Payer: Self-pay | Admitting: Emergency Medicine

## 2012-08-09 ENCOUNTER — Emergency Department (HOSPITAL_BASED_OUTPATIENT_CLINIC_OR_DEPARTMENT_OTHER)
Admission: EM | Admit: 2012-08-09 | Discharge: 2012-08-09 | Disposition: A | Discharge status: Home / Self Care | Payer: Self-pay | Attending: Emergency Medicine | Admitting: Emergency Medicine

## 2012-08-09 DIAGNOSIS — Z79899 Other long term (current) drug therapy: Secondary | ICD-10-CM | POA: Insufficient documentation

## 2012-08-09 DIAGNOSIS — S0993XA Unspecified injury of face, initial encounter: Secondary | ICD-10-CM | POA: Insufficient documentation

## 2012-08-09 DIAGNOSIS — I10 Essential (primary) hypertension: Secondary | ICD-10-CM | POA: Insufficient documentation

## 2012-08-09 DIAGNOSIS — S298XXA Other specified injuries of thorax, initial encounter: Secondary | ICD-10-CM | POA: Insufficient documentation

## 2012-08-09 DIAGNOSIS — Z87891 Personal history of nicotine dependence: Secondary | ICD-10-CM | POA: Insufficient documentation

## 2012-08-09 DIAGNOSIS — M129 Arthropathy, unspecified: Secondary | ICD-10-CM | POA: Insufficient documentation

## 2012-08-09 DIAGNOSIS — Z8719 Personal history of other diseases of the digestive system: Secondary | ICD-10-CM | POA: Insufficient documentation

## 2012-08-09 DIAGNOSIS — Y929 Unspecified place or not applicable: Secondary | ICD-10-CM | POA: Insufficient documentation

## 2012-08-09 DIAGNOSIS — X500XXA Overexertion from strenuous movement or load, initial encounter: Secondary | ICD-10-CM | POA: Insufficient documentation

## 2012-08-09 DIAGNOSIS — IMO0001 Reserved for inherently not codable concepts without codable children: Secondary | ICD-10-CM | POA: Insufficient documentation

## 2012-08-09 DIAGNOSIS — Y9389 Activity, other specified: Secondary | ICD-10-CM | POA: Insufficient documentation

## 2012-08-09 DIAGNOSIS — S335XXA Sprain of ligaments of lumbar spine, initial encounter: Secondary | ICD-10-CM | POA: Insufficient documentation

## 2012-08-09 DIAGNOSIS — S39012A Strain of muscle, fascia and tendon of lower back, initial encounter: Secondary | ICD-10-CM

## 2012-08-09 DIAGNOSIS — Z8742 Personal history of other diseases of the female genital tract: Secondary | ICD-10-CM | POA: Insufficient documentation

## 2012-08-09 DIAGNOSIS — S199XXA Unspecified injury of neck, initial encounter: Secondary | ICD-10-CM | POA: Insufficient documentation

## 2012-08-09 LAB — URINALYSIS, ROUTINE W REFLEX MICROSCOPIC
Glucose, UA: NEGATIVE mg/dL
Leukocytes, UA: NEGATIVE
Specific Gravity, Urine: 1.028 (ref 1.005–1.030)
pH: 6 (ref 5.0–8.0)

## 2012-08-09 LAB — URINE MICROSCOPIC-ADD ON

## 2012-08-09 MED ORDER — CARISOPRODOL 350 MG PO TABS: 350.0000 mg | ORAL_TABLET | Freq: Three times a day (TID) | ORAL | Status: DC

## 2012-08-09 MED ORDER — TRAMADOL HCL 50 MG PO TABS: 50.0000 mg | ORAL_TABLET | Freq: Four times a day (QID) | ORAL | Status: DC | PRN

## 2012-08-09 NOTE — ED Provider Notes (Signed)
History    This chart was scribed for Shelda Jakes, MD by Quintella Reichert, ED scribe.  This patient was seen in room MH10/MH10 and the patient's care was started at 4:31 PM.   CSN: [id number]  Arrival date & time 08/09/12  1539      Chief Complaint  Patient presents with  . Back Pain     Patient is a 52 y.o. female presenting with back pain. The history is provided by the patient. No language interpreter was used.  Back Pain Location:  Lumbar spine Quality: sharp. Radiates to:  L posterior upper leg Pain severity:  Severe Onset quality:  Gradual Duration:  3 days Timing:  Constant Progression:  Worsening Context: lifting heavy objects   Context: not falling, not MVA and not recent injury   Relieved by:  Nothing Worsened by:  Nothing tried Ineffective treatments: BC Powder. Associated symptoms: chest pain   Associated symptoms: no abdominal pain, no dysuria, no fever and no headaches     HPI Comments: Debra [name] is a 52 y.o. female with h/o endometriosis and fibromyalgia who presents to the Emergency Department complaining of severe, constant, progressively-worsening left lower back pain that began 3 days ago.  Pain is characterized as sharp and radiating into the posterior upper left thigh at a severity of 7/10.  Pain does not radiate into legs.  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  She denies any other recent injury.  She denies weakness or numbness in the feet.  Pt normally medicates with tramadol and 325 mg Soma (3x/day) for her back pain but ran out 2-3 days ago.  Pt attempted to treat pain with BC Powder, without relief.  She notes she has not taken her BP medication tonight.  She denies fever, chills, cough, congestion, rhinorrhea, visual changes, SOB, abdominal pain, nausea, emesis, diarrhea, dysuria, hematuria, swelling in legs, headache, or rash.  Pt denies h/o bleeding easily.  Pt  also notes intermittent mild neck pain, as well as 2 days of mild CP associated with past hiatal hernia.    PCP is Dr. Ivory Broad in [place], [place].  Past Medical History  Diagnosis Date  . Endometriosis   . Hiatal hernia   . Fibromyalgia   . Ovarian cyst   . Arthritis   . Hypertension     Past Surgical History  Procedure Laterality Date  . Tonsillectomy    . Appendectomy    . Laparoscopy      Family History  Problem Relation Age of Onset  . Kidney disease Mother   . Hypertension Mother     History  Substance Use Topics  . Smoking status: Former Games developer  . Smokeless tobacco: Never Used  . Alcohol Use: No    OB History   Grav Para Term Preterm Abortions TAB SAB Ect Mult Living   0 0 0 0 0 0 0 0 0 0       Review of Systems  Constitutional: Negative for fever and chills.  HENT: Positive for neck pain. Negative for congestion and rhinorrhea.   Eyes: Negative for visual disturbance.  Respiratory: Negative for cough and shortness of breath.   Cardiovascular: Positive for chest pain. Negative for leg swelling.  Gastrointestinal: Negative for nausea, vomiting, abdominal pain and diarrhea.  Genitourinary: Negative for dysuria and hematuria.  Musculoskeletal: Positive for myalgias and back pain.  Skin: Negative for rash.  Neurological: Negative for headaches.  Psychiatric/Behavioral: Negative for confusion.    Allergies  Morphine and related and Rocephin  Home Medications   Current Outpatient Rx  Name  Route  Sig  Dispense  Refill  . lisinopril (PRINIVIL,ZESTRIL) 20 MG tablet   Oral   Take 20 mg by mouth daily.         . Aspirin-Salicylamide-Caffeine (BC HEADACHE POWDER PO)   Oral   Take by mouth.         . carisoprodol (SOMA) 350 MG tablet   Oral   Take 350 mg by mouth 4 (four) times daily as needed. For pain.         . carisoprodol (SOMA) 350 MG tablet   Oral   Take 1 tablet (350 mg total) by mouth 3 (three) times daily.   20 tablet   1   .  traMADol (ULTRAM) 50 MG tablet   Oral   Take 100 mg by mouth every 4 (four) hours as needed. For pain.         . traMADol (ULTRAM) 50 MG tablet   Oral   Take 1 tablet (50 mg total) by mouth every 6 (six) hours as needed for pain.   20 tablet   0     BP 180/115  Pulse 109  Temp(Src) 98.8 F (37.1 C)  Resp 16  Ht 5\' 3"  (1.6 m)  Wt 220 lb (99.791 kg)  BMI 38.98 kg/m2  SpO2 100%  Physical Exam  Nursing note and vitals reviewed. Constitutional: She is oriented to person, place, and time. She appears well-developed and well-nourished. No distress.  HENT:  Head: Normocephalic.  Eyes: Conjunctivae and EOM are normal. Pupils are equal, round, and reactive to light. Right eye exhibits no discharge. Left eye exhibits no discharge.  Neck: Normal range of motion. Neck supple. No tracheal deviation present.  Cardiovascular: Normal rate, regular rhythm and normal heart sounds.   No murmur heard. Pulmonary/Chest: Effort normal and breath sounds normal. No respiratory distress. She has no wheezes. She has no rales.  Abdominal: Soft. Bowel sounds are normal. There is no tenderness.  Musculoskeletal: Normal range of motion. She exhibits tenderness (Tender in left lumbar paraspinous region). She exhibits no edema.  Neurological: She is alert and oriented to person, place, and time. Coordination normal.  Skin: Skin is warm and dry. No rash noted.  Psychiatric: She has a normal mood and affect. Her behavior is normal.    ED Course  Procedures (including critical care time)  DIAGNOSTIC STUDIES: Oxygen Saturation is 100% on room air, normal by my interpretation.    COORDINATION OF CARE: 4:36 PM-Discussed treatment plan which includes UA, tramadol and Soma with pt at bedside and pt agreed to plan.      1. Lumbar strain, initial encounter       MDM  Patient with a history of fibromyalgia the symptoms seem to be a lumbar strain predominant on the left side. Patient in the past is  taken soma and tramadol for this she has been out of them for the past 2-3 days. We'll renew the medication for her. No direct trauma or injury to the back. Patient without any neuro focal deficits.     I personally performed the services described in this documentation, which was scribed in my presence. The recorded information has been reviewed and is accurate.     Shelda Jakes, MD 08/09/12 [phone]

## 2012-08-09 NOTE — ED Notes (Signed)
Pt has lower back pain for approximately three days.  Pt states she is out of tramadol and soma needs refill.  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

## 2012-08-09 NOTE — ED Notes (Signed)
Patient states that she is having left side lower back pain. Doesn't have any money and is unable to see the PCP, who requires her to come in before giving her any medication. Patient has been in [place] taking care of her mother who recently passed.

## 2012-08-28 ENCOUNTER — Emergency Department (HOSPITAL_BASED_OUTPATIENT_CLINIC_OR_DEPARTMENT_OTHER)
Admission: EM | Admit: 2012-08-28 | Discharge: 2012-08-28 | Disposition: A | Discharge status: Home / Self Care | Payer: Self-pay | Attending: Emergency Medicine | Admitting: Emergency Medicine

## 2012-08-28 ENCOUNTER — Encounter (HOSPITAL_BASED_OUTPATIENT_CLINIC_OR_DEPARTMENT_OTHER): Payer: Self-pay | Admitting: *Deleted

## 2012-08-28 DIAGNOSIS — Z8739 Personal history of other diseases of the musculoskeletal system and connective tissue: Secondary | ICD-10-CM | POA: Insufficient documentation

## 2012-08-28 DIAGNOSIS — G8929 Other chronic pain: Secondary | ICD-10-CM | POA: Insufficient documentation

## 2012-08-28 DIAGNOSIS — M25569 Pain in unspecified knee: Secondary | ICD-10-CM | POA: Insufficient documentation

## 2012-08-28 DIAGNOSIS — Z87891 Personal history of nicotine dependence: Secondary | ICD-10-CM | POA: Insufficient documentation

## 2012-08-28 DIAGNOSIS — Z8719 Personal history of other diseases of the digestive system: Secondary | ICD-10-CM | POA: Insufficient documentation

## 2012-08-28 DIAGNOSIS — M25562 Pain in left knee: Secondary | ICD-10-CM

## 2012-08-28 DIAGNOSIS — I10 Essential (primary) hypertension: Secondary | ICD-10-CM | POA: Insufficient documentation

## 2012-08-28 DIAGNOSIS — Z79899 Other long term (current) drug therapy: Secondary | ICD-10-CM | POA: Insufficient documentation

## 2012-08-28 DIAGNOSIS — R269 Unspecified abnormalities of gait and mobility: Secondary | ICD-10-CM | POA: Insufficient documentation

## 2012-08-28 DIAGNOSIS — IMO0001 Reserved for inherently not codable concepts without codable children: Secondary | ICD-10-CM | POA: Insufficient documentation

## 2012-08-28 MED ORDER — HYDROCODONE-ACETAMINOPHEN 5-325 MG PO TABS: 2.0000 | ORAL_TABLET | ORAL | Status: DC | PRN

## 2012-08-28 NOTE — ED Notes (Signed)
Feet are swelling. Knee pain. Ran out of hydrocodone.

## 2012-08-28 NOTE — ED Provider Notes (Signed)
History     CSN: [id number]  Arrival date & time 08/28/12  1320   First MD Initiated Contact with Patient 08/28/12 1412      Chief Complaint  Patient presents with  . Joint Swelling    (Consider location/radiation/quality/duration/timing/severity/associated sxs/prior treatment) Patient is a 52 y.o. female presenting with knee pain. The history is provided by the patient. No language interpreter was used.  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   Pt complains of pain with walking.  Pt reports she ran out of Hydrocodone.  Pt reports no current MD.  Pt request referrals  Past Medical History  Diagnosis Date  . Endometriosis   . Hiatal hernia   . Fibromyalgia   . Ovarian cyst   . Arthritis   . Hypertension     Past Surgical History  Procedure Laterality Date  . Tonsillectomy    . Appendectomy    . Laparoscopy      Family History  Problem Relation Age of Onset  . Kidney disease Mother   . Hypertension Mother     History  Substance Use Topics  . Smoking status: Former Games developer  . Smokeless tobacco: Never Used  . Alcohol Use: No    OB History   Grav Para Term Preterm Abortions TAB SAB Ect Mult Living   0 0 0 0 0 0 0 0 0 0       Review of Systems  Musculoskeletal: Positive for myalgias, joint swelling and gait problem.  All other systems reviewed and are negative.    Allergies  Morphine and related and Rocephin  Home Medications   Current Outpatient Rx  Name  Route  Sig  Dispense  Refill  . Aspirin-Salicylamide-Caffeine (BC HEADACHE POWDER PO)   Oral   Take by mouth.         . carisoprodol (SOMA) 350 MG tablet   Oral   Take 350 mg by mouth 4 (four) times  daily as needed. For pain.         . carisoprodol (SOMA) 350 MG tablet   Oral   Take 1 tablet (350 mg total) by mouth 3 (three) times daily.   20 tablet   1   . lisinopril (PRINIVIL,ZESTRIL) 20 MG tablet   Oral   Take 20 mg by mouth daily.         . traMADol (ULTRAM) 50 MG tablet   Oral   Take 100 mg by mouth every 4 (four) hours as needed. For pain.         . traMADol (ULTRAM) 50 MG tablet   Oral   Take 1 tablet (50 mg total) by mouth every 6 (six) hours as needed for pain.   20 tablet   0     BP 155/101  Pulse 110  Temp(Src) 98.1 F (36.7 C) (Oral)  Resp 18  Wt 220 lb (99.791 kg)  BMI 38.98 kg/m2  SpO2 100%  Physical Exam  Nursing note and vitals reviewed. Constitutional: She appears well-developed and well-nourished.  HENT:  Head: Normocephalic.  Cardiovascular: Normal rate.   Pulmonary/Chest: Effort normal.  Musculoskeletal: She exhibits tenderness.  bilat knees swollen,  Diffusely  tender  Neurological: She is alert. She has normal reflexes.  Skin: Skin is warm.  Psychiatric: She has a normal mood and affect.    ED Course  Procedures (including critical care time)  Labs Reviewed - No data to display No results found.   1. Knee pain, bilateral       MDM  Hydrocodone 20         Lonia Skinner [place], [place] 08/28/12 1453

## 2012-08-28 NOTE — ED Provider Notes (Signed)
Medical screening examination/treatment/procedure(s) were performed by non-physician practitioner and as supervising physician I was immediately available for consultation/collaboration.   Charles B. Bernette Mayers, MD 08/28/12 1500

## 2012-08-28 NOTE — ED Notes (Signed)
Patient ambulates without difficulty or assistance 

## 2012-09-02 ENCOUNTER — Emergency Department (HOSPITAL_BASED_OUTPATIENT_CLINIC_OR_DEPARTMENT_OTHER)
Admission: EM | Admit: 2012-09-02 | Discharge: 2012-09-02 | Disposition: A | Discharge status: Home / Self Care | Payer: Self-pay | Attending: Emergency Medicine | Admitting: Emergency Medicine

## 2012-09-02 ENCOUNTER — Encounter (HOSPITAL_BASED_OUTPATIENT_CLINIC_OR_DEPARTMENT_OTHER): Payer: Self-pay | Admitting: *Deleted

## 2012-09-02 DIAGNOSIS — Z8742 Personal history of other diseases of the female genital tract: Secondary | ICD-10-CM | POA: Insufficient documentation

## 2012-09-02 DIAGNOSIS — G8929 Other chronic pain: Secondary | ICD-10-CM | POA: Insufficient documentation

## 2012-09-02 DIAGNOSIS — Z79899 Other long term (current) drug therapy: Secondary | ICD-10-CM | POA: Insufficient documentation

## 2012-09-02 DIAGNOSIS — I10 Essential (primary) hypertension: Secondary | ICD-10-CM | POA: Insufficient documentation

## 2012-09-02 DIAGNOSIS — Z87891 Personal history of nicotine dependence: Secondary | ICD-10-CM | POA: Insufficient documentation

## 2012-09-02 DIAGNOSIS — IMO0001 Reserved for inherently not codable concepts without codable children: Secondary | ICD-10-CM | POA: Insufficient documentation

## 2012-09-02 DIAGNOSIS — Z8739 Personal history of other diseases of the musculoskeletal system and connective tissue: Secondary | ICD-10-CM | POA: Insufficient documentation

## 2012-09-02 DIAGNOSIS — Z8719 Personal history of other diseases of the digestive system: Secondary | ICD-10-CM | POA: Insufficient documentation

## 2012-09-02 MED ORDER — LISINOPRIL 20 MG PO TABS: 20.0000 mg | ORAL_TABLET | Freq: Every day | ORAL | Status: DC

## 2012-09-02 MED ORDER — TRAMADOL HCL 50 MG PO TABS
50.0000 mg | ORAL_TABLET | Freq: Once | ORAL | Status: AC
  Administered 2012-09-02: 50 mg via ORAL
  Filled 2012-09-02: qty 1

## 2012-09-02 NOTE — ED Provider Notes (Signed)
History    CSN: [id number] Arrival date & time 09/02/12  0907  First MD Initiated Contact with Patient 09/02/12 [phone]     Chief Complaint  Patient presents with  . Medication Refill   (Consider location/radiation/quality/duration/timing/severity/associated sxs/prior Treatment) HPI Pt with history of chronic knee pain and fibromyalgia presents to the ED requesting refill on Soma and Tramadol Rx she has been getting from various doctors including a doctor in [place] and Dr. Ivory Broad in [facility]. She has had numerous ED visits for pain related complaints, including knee pain 5 days ago when she was given Rx for hydrocodone which she has since run out of. She states she cannot get in to see her PCP any more because she owes them money and she is broke. No acute complaints.   Past Medical History  Diagnosis Date  . Endometriosis   . Hiatal hernia   . Fibromyalgia   . Ovarian cyst   . Arthritis   . Hypertension    Past Surgical History  Procedure Laterality Date  . Tonsillectomy    . Appendectomy    . Laparoscopy     Family History  Problem Relation Age of Onset  . Kidney disease Mother   . Hypertension Mother    History  Substance Use Topics  . Smoking status: Former Games developer  . Smokeless tobacco: Never Used  . Alcohol Use: No   OB History   Grav Para Term Preterm Abortions TAB SAB Ect Mult Living   0 0 0 0 0 0 0 0 0 0      Review of Systems All other systems reviewed and are negative except as noted in HPI.   Allergies  Morphine and related and Rocephin  Home Medications   Current Outpatient Rx  Name  Route  Sig  Dispense  Refill  . traMADol (ULTRAM) 50 MG tablet   Oral   Take 1 tablet (50 mg total) by mouth every 6 (six) hours as needed for pain.   20 tablet   0   . Aspirin-Salicylamide-Caffeine (BC HEADACHE POWDER PO)   Oral   Take by mouth.         . carisoprodol (SOMA) 350 MG tablet   Oral   Take 350 mg by mouth 4 (four) times daily as needed. For  pain.         . carisoprodol (SOMA) 350 MG tablet   Oral   Take 1 tablet (350 mg total) by mouth 3 (three) times daily.   20 tablet   1   . HYDROcodone-acetaminophen (NORCO/VICODIN) 5-325 MG per tablet   Oral   Take 2 tablets by mouth every 4 (four) hours as needed.   20 tablet   0   . lisinopril (PRINIVIL,ZESTRIL) 20 MG tablet   Oral   Take 20 mg by mouth daily.         . traMADol (ULTRAM) 50 MG tablet   Oral   Take 100 mg by mouth every 4 (four) hours as needed. For pain.          BP 184/100  Temp(Src) 98.6 F (37 C) (Oral)  Resp 20  Ht 5\' 3"  (1.6 m)  Wt 220 lb (99.791 kg)  BMI 38.98 kg/m2  SpO2 100% Physical Exam  Constitutional: She is oriented to person, place, and time. She appears well-developed and well-nourished.  HENT:  Head: Normocephalic and atraumatic.  Neck: Neck supple.  Pulmonary/Chest: Effort normal.  Neurological: She is alert and oriented to person, place,  and time. No cranial nerve deficit.  Psychiatric: She has a normal mood and affect. Her behavior is normal.    ED Course  Procedures (including critical care time) Labs Reviewed - No data to display No results found. 1. Chronic pain     MDM  Review of the [place] Controlled substance database shows the patient had a 30day Rx for Soma filled about 21 days ago. Also given hydrocodone Rx from here 5 days ago. Advised that no further Rx for chronic pain would be prescribed in the ED. Given referral to [facility] for follow up and long term management of her chronic medical problems.   Charles B. Bernette Mayers, MD 09/02/12 [phone]

## 2012-09-02 NOTE — ED Notes (Signed)
Patient states she is out of her Soma and Tramadol medications for her chronic bilateral knee pain and fibromyalgia.  States she just moved here from [place], is not working and does not have insurance.

## 2012-09-02 NOTE — ED Notes (Signed)
Patient very upset that she did not receive a prescription for tramadol.  At triage pt stated she was taking her lisinopril but while being discharged she stated that she was out of her blood pressure as well.  Demanded that I speak with the Dr. Bernette Mayers to get a prescription for her bp meds and at least 10 tramadol to get her through the day.  Reviewed with Dr. Bernette Mayers, prescription received for Lisinopril. Pt was discharged, refused to sign her discharge paperwork, on the way out the front door, she tossed her discharge paperwork and her prescription on the registration desk and left.  Dr. Bernette Mayers informed.

## 2012-10-16 ENCOUNTER — Ambulatory Visit: Payer: Self-pay

## 2012-10-30 ENCOUNTER — Encounter (HOSPITAL_BASED_OUTPATIENT_CLINIC_OR_DEPARTMENT_OTHER): Payer: Self-pay

## 2012-10-30 ENCOUNTER — Emergency Department (HOSPITAL_BASED_OUTPATIENT_CLINIC_OR_DEPARTMENT_OTHER)
Admission: EM | Admit: 2012-10-30 | Discharge: 2012-10-30 | Disposition: A | Discharge status: Home / Self Care | Payer: Self-pay | Attending: Emergency Medicine | Admitting: Emergency Medicine

## 2012-10-30 ENCOUNTER — Emergency Department (HOSPITAL_BASED_OUTPATIENT_CLINIC_OR_DEPARTMENT_OTHER): Payer: Self-pay

## 2012-10-30 DIAGNOSIS — IMO0001 Reserved for inherently not codable concepts without codable children: Secondary | ICD-10-CM | POA: Insufficient documentation

## 2012-10-30 DIAGNOSIS — M549 Dorsalgia, unspecified: Secondary | ICD-10-CM | POA: Insufficient documentation

## 2012-10-30 DIAGNOSIS — M129 Arthropathy, unspecified: Secondary | ICD-10-CM | POA: Insufficient documentation

## 2012-10-30 DIAGNOSIS — D259 Leiomyoma of uterus, unspecified: Secondary | ICD-10-CM | POA: Insufficient documentation

## 2012-10-30 DIAGNOSIS — Z3202 Encounter for pregnancy test, result negative: Secondary | ICD-10-CM | POA: Insufficient documentation

## 2012-10-30 DIAGNOSIS — N898 Other specified noninflammatory disorders of vagina: Secondary | ICD-10-CM | POA: Insufficient documentation

## 2012-10-30 DIAGNOSIS — Z8719 Personal history of other diseases of the digestive system: Secondary | ICD-10-CM | POA: Insufficient documentation

## 2012-10-30 DIAGNOSIS — Z79899 Other long term (current) drug therapy: Secondary | ICD-10-CM | POA: Insufficient documentation

## 2012-10-30 DIAGNOSIS — D219 Benign neoplasm of connective and other soft tissue, unspecified: Secondary | ICD-10-CM

## 2012-10-30 DIAGNOSIS — Z8742 Personal history of other diseases of the female genital tract: Secondary | ICD-10-CM | POA: Insufficient documentation

## 2012-10-30 DIAGNOSIS — Z87891 Personal history of nicotine dependence: Secondary | ICD-10-CM | POA: Insufficient documentation

## 2012-10-30 DIAGNOSIS — I10 Essential (primary) hypertension: Secondary | ICD-10-CM | POA: Insufficient documentation

## 2012-10-30 LAB — CBC WITH DIFFERENTIAL/PLATELET
Eosinophils Relative: 1 % (ref 0–5)
HCT: 34.1 % — ABNORMAL LOW (ref 36.0–46.0)
Hemoglobin: 11.7 g/dL — ABNORMAL LOW (ref 12.0–15.0)
Lymphocytes Relative: 39 % (ref 12–46)
MCHC: 34.3 g/dL (ref 30.0–36.0)
MCV: 88.3 fL (ref 78.0–100.0)
Monocytes Absolute: 0.7 10*3/uL (ref 0.1–1.0)
Monocytes Relative: 9 % (ref 3–12)
Neutro Abs: 3.8 10*3/uL (ref 1.7–7.7)
WBC: 7.6 10*3/uL (ref 4.0–10.5)

## 2012-10-30 LAB — COMPREHENSIVE METABOLIC PANEL
BUN: 19 mg/dL (ref 6–23)
CO2: 23 mEq/L (ref 19–32)
Chloride: 108 mEq/L (ref 96–112)
Creatinine, Ser: 0.6 mg/dL (ref 0.50–1.10)
GFR calc Af Amer: >90 mL/min (ref >90)
GFR calc non Af Amer: >90 mL/min (ref >90)
Total Bilirubin: <0.1 mg/dL — ABNORMAL LOW (ref 0.3–1.2)

## 2012-10-30 LAB — PREGNANCY, URINE: Preg Test, Ur: NEGATIVE

## 2012-10-30 LAB — URINALYSIS, ROUTINE W REFLEX MICROSCOPIC
Bilirubin Urine: NEGATIVE
Glucose, UA: NEGATIVE mg/dL
Hgb urine dipstick: NEGATIVE
Protein, ur: NEGATIVE mg/dL

## 2012-10-30 LAB — WET PREP, GENITAL: Yeast Wet Prep HPF POC: NONE SEEN

## 2012-10-30 LAB — LIPASE, BLOOD: Lipase: 99 U/L — ABNORMAL HIGH (ref 11–59)

## 2012-10-30 MED ORDER — TRAMADOL HCL 50 MG PO TABS: 50.0000 mg | ORAL_TABLET | Freq: Four times a day (QID) | ORAL | Status: DC | PRN

## 2012-10-30 MED ORDER — SODIUM CHLORIDE 0.9 % IV BOLUS (SEPSIS)
1000.0000 mL | Freq: Once | INTRAVENOUS | Status: AC
  Administered 2012-10-30: 1000 mL via INTRAVENOUS

## 2012-10-30 NOTE — ED Notes (Signed)
Patient transported to Ultrasound 

## 2012-10-30 NOTE — ED Provider Notes (Signed)
CSN: [id number]     Arrival date & time 10/30/12  1921 History     First MD Initiated Contact with Patient 10/30/12 2012     Chief Complaint  Patient presents with  . Abdominal Pain   (Consider location/radiation/quality/duration/timing/severity/associated sxs/prior Treatment) HPI Comments: Patient is a 52 year old female with a past medical history of endometriosis and hypertension who presents with abdominal pain for the past 10 days. The pain is located in her left lower abdomen and radiates to her left lower back. The pain is described as aching and severe. The pain started gradually and progressively worsened since the onset. No alleviating/aggravating factors. The patient has tried nothing for symptoms without relief. Associated symptoms include vaginal bleeding. Patient denies fever, headache, NVD, chest pain, SOB, dysuria, constipation. Patient reports her LMP was 6 months ago prior to this episode of bleeding.   Patient is a 52 y.o. female presenting with abdominal pain.  Abdominal Pain Associated symptoms: vaginal bleeding     Past Medical History  Diagnosis Date  . Endometriosis   . Hiatal hernia   . Fibromyalgia   . Ovarian cyst   . Arthritis   . Hypertension    Past Surgical History  Procedure Laterality Date  . Tonsillectomy    . Appendectomy    . Laparoscopy     Family History  Problem Relation Age of Onset  . Kidney disease Mother   . Hypertension Mother    History  Substance Use Topics  . Smoking status: Former Games developer  . Smokeless tobacco: Never Used  . Alcohol Use: No   OB History   Grav Para Term Preterm Abortions TAB SAB Ect Mult Living   0 0 0 0 0 0 0 0 0 0      Review of Systems  Gastrointestinal: Positive for abdominal pain.  Genitourinary: Positive for vaginal bleeding.  Musculoskeletal: Positive for back pain.  All other systems reviewed and are negative.    Allergies  Morphine and related and Rocephin  Home Medications   Current  Outpatient Rx  Name  Route  Sig  Dispense  Refill  . Aspirin-Salicylamide-Caffeine (BC HEADACHE POWDER PO)   Oral   Take by mouth.         . carisoprodol (SOMA) 350 MG tablet   Oral   Take 1 tablet (350 mg total) by mouth 3 (three) times daily.   20 tablet   1   . lisinopril (PRINIVIL,ZESTRIL) 20 MG tablet   Oral   Take 1 tablet (20 mg total) by mouth daily.   30 tablet   0   . traMADol (ULTRAM) 50 MG tablet   Oral   Take 1 tablet (50 mg total) by mouth every 6 (six) hours as needed for pain.   20 tablet   0    BP 157/90  Pulse 113  Temp(Src) 98.9 F (37.2 C) (Oral)  Resp 18  Ht 5' 3.5" (1.613 m)  Wt 215 lb (97.523 kg)  BMI 37.48 kg/m2  SpO2 96% Physical Exam  Nursing note and vitals reviewed. Constitutional: She is oriented to person, place, and time. She appears well-developed and well-nourished. No distress.  HENT:  Head: Normocephalic and atraumatic.  Eyes: Conjunctivae and EOM are normal. Pupils are equal, round, and reactive to light. No scleral icterus.  Neck: Normal range of motion.  Cardiovascular: Normal rate and regular rhythm.  Exam reveals no gallop and no friction rub.   No murmur heard. Pulmonary/Chest: Effort normal and breath sounds  normal. She has no wheezes. She has no rales. She exhibits no tenderness.  Abdominal: Soft. She exhibits no distension. There is tenderness. There is no rebound and no guarding.  Left lower abdominal tenderness to palpation. No peritoneal signs.   Genitourinary:  Normal external genitalia. No blood or discharge noted in vagina. No CMT. Cervical os closed. Left adnexal tenderness to palpation. No masses palpated.   Musculoskeletal: Normal range of motion.  Neurological: She is alert and oriented to person, place, and time. Coordination normal.  Speech is goal-oriented. Moves limbs without ataxia.   Skin: Skin is warm and dry.  Psychiatric: She has a normal mood and affect.    ED Course   Procedures (including  critical care time)  Labs Reviewed  WET PREP, GENITAL - Abnormal; Notable for the following:    Clue Cells Wet Prep HPF POC MODERATE (*)    WBC, Wet Prep HPF POC FEW (*)    All other components within normal limits  CBC WITH DIFFERENTIAL - Abnormal; Notable for the following:    RBC 3.86 (*)    Hemoglobin 11.7 (*)    HCT 34.1 (*)    All other components within normal limits  COMPREHENSIVE METABOLIC PANEL - Abnormal; Notable for the following:    Glucose, Bld 137 (*)    Albumin 3.3 (*)    Total Bilirubin <0.1 (*)    All other components within normal limits  LIPASE, BLOOD - Abnormal; Notable for the following:    Lipase 99 (*)    All other components within normal limits  GC/CHLAMYDIA PROBE AMP  URINALYSIS, ROUTINE W REFLEX MICROSCOPIC  PREGNANCY, URINE   US Transvaginal Non-ob  10/30/2012   *RADIOLOGY REPORT*  Clinical Data: Left lower quadrant pain and back pain.  History of uterine fibroids, ovarian cyst, hypertension, fibromyalgia, endometriosis, appendectomy, and multiple laparoscopies.  TRANSABDOMINAL AND TRANSVAGINAL ULTRASOUND OF PELVIS Technique:  Both transabdominal and transvaginal ultrasound examinations of the pelvis were performed. Transabdominal technique was performed for global imaging of the pelvis including uterus, ovaries, adnexal regions, and pelvic cul-de-sac.  It was necessary to proceed with endovaginal exam following the transabdominal exam to visualize the ovaries and endometrium.  Comparison:  CT abdomen and pelvis 08/30/2012  Findings:  Uterus: The uterus is anteverted and measures 7.3 x 3.7 x 3.7 cm. The myometrial echotexture is diffusely heterogeneous with focal lesions posteriorly measuring about 1.8 and 1.4 cm maximal diameter.  This is consistent with uterine fibroids. Nabothian cyst in the cervix.  Endometrium: Normal endometrial stripe thickness, measuring 8.4 mm. No abnormal endometrial fluid collections.  Right ovary:  Right ovary is not visualized. No  adnexal masses demonstrated on the axial views.  Left ovary: The left ovary measures 2.5 x 1.5 x 2.1 cm.  No abnormal adnexal mass lesions demonstrated.  Flow is demonstrated in the left ovary on color flow Doppler imaging.  Other findings: No free fluid  IMPRESSION: Fibroid uterus.  Normal endometrium and left ovary.  Right ovary is not visualized.   Original Report Authenticated By: Burman Nieves, M.D.   US Pelvis Complete  10/30/2012   *RADIOLOGY REPORT*  Clinical Data: Left lower quadrant pain and back pain.  History of uterine fibroids, ovarian cyst, hypertension, fibromyalgia, endometriosis, appendectomy, and multiple laparoscopies.  TRANSABDOMINAL AND TRANSVAGINAL ULTRASOUND OF PELVIS Technique:  Both transabdominal and transvaginal ultrasound examinations of the pelvis were performed. Transabdominal technique was performed for global imaging of the pelvis including uterus, ovaries, adnexal regions, and pelvic cul-de-sac.  It was  necessary to proceed with endovaginal exam following the transabdominal exam to visualize the ovaries and endometrium.  Comparison:  CT abdomen and pelvis 08/30/2012  Findings:  Uterus: The uterus is anteverted and measures 7.3 x 3.7 x 3.7 cm. The myometrial echotexture is diffusely heterogeneous with focal lesions posteriorly measuring about 1.8 and 1.4 cm maximal diameter.  This is consistent with uterine fibroids. Nabothian cyst in the cervix.  Endometrium: Normal endometrial stripe thickness, measuring 8.4 mm. No abnormal endometrial fluid collections.  Right ovary:  Right ovary is not visualized. No adnexal masses demonstrated on the axial views.  Left ovary: The left ovary measures 2.5 x 1.5 x 2.1 cm.  No abnormal adnexal mass lesions demonstrated.  Flow is demonstrated in the left ovary on color flow Doppler imaging.  Other findings: No free fluid  IMPRESSION: Fibroid uterus.  Normal endometrium and left ovary.  Right ovary is not visualized.   Original Report  Authenticated By: Burman Nieves, M.D.   1. Fibroids     MDM  9:25 PM Labs unremarkable. Wet prep pending. Patient will have pelvic US to evaluate adnexal tenderness.   10:04 PM Pelvic done. Patient will have pelvic US. Patient signed to Dr. Jeraldine Loots.   Emilia Beck, [place] 10/31/12 2327

## 2012-10-30 NOTE — ED Notes (Signed)
Pt. Reports she has been hurting for a wk and maybe longer with a lite period.  Pt. Reports no bleeding now.  Pt. Did drive herself here.

## 2012-10-30 NOTE — ED Notes (Signed)
C/o abd pain,lower back pain, vaginal bleeding x 1.5 weeks

## 2012-11-01 NOTE — ED Provider Notes (Signed)
  This was a shared visit with a mid-level provided (NP or PA).  Throughout the patient's course I was available for consultation/collaboration.  I saw the relevant labs and studies - I agree with the interpretation.  On my exam the patient was in no distress.  With the presence of uterine fibroids we discussed discharge with close gynecology followup.  Patient states that she has a gynecologist, will call her tomorrow for followup.      Gerhard Munch, MD 11/01/12 [phone]

## 2012-11-19 ENCOUNTER — Encounter (HOSPITAL_BASED_OUTPATIENT_CLINIC_OR_DEPARTMENT_OTHER): Payer: Self-pay | Admitting: *Deleted

## 2012-11-19 ENCOUNTER — Emergency Department (HOSPITAL_BASED_OUTPATIENT_CLINIC_OR_DEPARTMENT_OTHER)
Admission: EM | Admit: 2012-11-19 | Discharge: 2012-11-19 | Disposition: A | Discharge status: Home / Self Care | Payer: Self-pay | Attending: Emergency Medicine | Admitting: Emergency Medicine

## 2012-11-19 DIAGNOSIS — Z8739 Personal history of other diseases of the musculoskeletal system and connective tissue: Secondary | ICD-10-CM | POA: Insufficient documentation

## 2012-11-19 DIAGNOSIS — R1084 Generalized abdominal pain: Secondary | ICD-10-CM | POA: Insufficient documentation

## 2012-11-19 DIAGNOSIS — Z8742 Personal history of other diseases of the female genital tract: Secondary | ICD-10-CM | POA: Insufficient documentation

## 2012-11-19 DIAGNOSIS — Z9089 Acquired absence of other organs: Secondary | ICD-10-CM | POA: Insufficient documentation

## 2012-11-19 DIAGNOSIS — Z3202 Encounter for pregnancy test, result negative: Secondary | ICD-10-CM | POA: Insufficient documentation

## 2012-11-19 DIAGNOSIS — D219 Benign neoplasm of connective and other soft tissue, unspecified: Secondary | ICD-10-CM

## 2012-11-19 DIAGNOSIS — Z79899 Other long term (current) drug therapy: Secondary | ICD-10-CM | POA: Insufficient documentation

## 2012-11-19 DIAGNOSIS — M129 Arthropathy, unspecified: Secondary | ICD-10-CM | POA: Insufficient documentation

## 2012-11-19 DIAGNOSIS — G8929 Other chronic pain: Secondary | ICD-10-CM | POA: Insufficient documentation

## 2012-11-19 DIAGNOSIS — D259 Leiomyoma of uterus, unspecified: Secondary | ICD-10-CM | POA: Insufficient documentation

## 2012-11-19 DIAGNOSIS — Z7982 Long term (current) use of aspirin: Secondary | ICD-10-CM | POA: Insufficient documentation

## 2012-11-19 DIAGNOSIS — Z8719 Personal history of other diseases of the digestive system: Secondary | ICD-10-CM | POA: Insufficient documentation

## 2012-11-19 DIAGNOSIS — Z87891 Personal history of nicotine dependence: Secondary | ICD-10-CM | POA: Insufficient documentation

## 2012-11-19 HISTORY — DX: Benign neoplasm of connective and other soft tissue, unspecified: D21.9

## 2012-11-19 LAB — PREGNANCY, URINE: Preg Test, Ur: NEGATIVE

## 2012-11-19 LAB — URINALYSIS, ROUTINE W REFLEX MICROSCOPIC
Glucose, UA: NEGATIVE mg/dL
Hgb urine dipstick: NEGATIVE
Specific Gravity, Urine: 1.007 (ref 1.005–1.030)
Urobilinogen, UA: 0.2 mg/dL (ref 0.0–1.0)
pH: 6 (ref 5.0–8.0)

## 2012-11-19 MED ORDER — TRAMADOL HCL 50 MG PO TABS: 50.0000 mg | ORAL_TABLET | Freq: Four times a day (QID) | ORAL | Status: DC | PRN

## 2012-11-19 MED ORDER — TRAMADOL HCL 50 MG PO TABS
100.0000 mg | ORAL_TABLET | Freq: Once | ORAL | Status: AC
  Administered 2012-11-19: 100 mg via ORAL
  Filled 2012-11-19: qty 2

## 2012-11-19 NOTE — ED Provider Notes (Signed)
CSN: [id number]     Arrival date & time 11/19/12  1218 History   First MD Initiated Contact with Patient 11/19/12 1321     Chief Complaint  Patient presents with  . Abdominal Pain   (Consider location/radiation/quality/duration/timing/severity/associated sxs/prior Treatment) HPI Comments: Pt states that she had a fibroid and she has chronic pain associated with it and she is out of her ultram and she is here because she needs more medication:nothing has changed with the pain:denies fever, n/v/d:pt states that she seen [facility] but she couldn't find the number so she decided to come in here that day:pt states that she has had multiple laparoscopic surgeries but she from all her adhesions she had a sbo and so she is not sure what they are going to do with her  Patient is a 52 y.o. female presenting with abdominal pain. The history is provided by the patient. No language interpreter was used.  Abdominal Pain Pain location:  Generalized Pain quality: aching   Pain radiates to:  Does not radiate Pain severity:  Moderate Timing:  Constant   Past Medical History  Diagnosis Date  . Endometriosis   . Hiatal hernia   . Fibromyalgia   . Ovarian cyst   . Arthritis   . Hypertension   . Fibroid tumor    Past Surgical History  Procedure Laterality Date  . Tonsillectomy    . Appendectomy    . Laparoscopy     Family History  Problem Relation Age of Onset  . Kidney disease Mother   . Hypertension Mother    History  Substance Use Topics  . Smoking status: Former Games developer  . Smokeless tobacco: Never Used  . Alcohol Use: No   OB History   Grav Para Term Preterm Abortions TAB SAB Ect Mult Living   0 0 0 0 0 0 0 0 0 0      Review of Systems  Constitutional: Negative.   Respiratory: Negative.   Cardiovascular: Negative.   Gastrointestinal: Positive for abdominal pain.    Allergies  Morphine and related and Rocephin  Home Medications   Current Outpatient  Rx  Name  Route  Sig  Dispense  Refill  . traMADol (ULTRAM) 50 MG tablet   Oral   Take 1 tablet (50 mg total) by mouth every 6 (six) hours as needed for pain.   20 tablet   0   . Aspirin-Salicylamide-Caffeine (BC HEADACHE POWDER PO)   Oral   Take 4 Units by mouth 4 (four) times daily.          . carisoprodol (SOMA) 350 MG tablet   Oral   Take 1 tablet (350 mg total) by mouth 3 (three) times daily.   20 tablet   1   . lisinopril (PRINIVIL,ZESTRIL) 20 MG tablet   Oral   Take 1 tablet (20 mg total) by mouth daily.   30 tablet   0   . traMADol (ULTRAM) 50 MG tablet   Oral   Take 1 tablet (50 mg total) by mouth every 6 (six) hours as needed for pain.   15 tablet   0    BP 188/89  Temp(Src) 99 F (37.2 C) (Oral)  Resp 20  SpO2 100% Physical Exam  Nursing note and vitals reviewed. Constitutional: She is oriented to person, place, and time. She appears well-developed and well-nourished.  HENT:  Head: Normocephalic and atraumatic.  Cardiovascular: Normal rate and regular rhythm.   Pulmonary/Chest: Effort  normal and breath sounds normal.  Abdominal:  Lower abdominal tenderness  Musculoskeletal: Normal range of motion.  Neurological: She is alert and oriented to person, place, and time.    ED Course  Procedures (including critical care time) Labs Review Labs Reviewed  URINALYSIS, ROUTINE W REFLEX MICROSCOPIC  PREGNANCY, URINE   Imaging Review No results found.  MDM   1. Fibroids   2. Chronic abdominal pain    Pt given something for pain and [facility]:doubt any acute process:think it is likely related to her chronic problems    Teressa Lower, NP 11/19/12 1645

## 2012-11-19 NOTE — ED Notes (Signed)
Patient states she has continued pain in her abdominal form the fibroid tumor we found in August.  States she is out of her pain meds and is having continually pain and nausea and is out of her medications.

## 2012-11-20 NOTE — ED Provider Notes (Signed)
Medical screening examination/treatment/procedure(s) were performed by non-physician practitioner and as supervising physician I was immediately available for consultation/collaboration.   Starleen Trussell H Imonie Tuch, MD 11/20/12 0903 

## 2012-11-26 IMAGING — CR DG KNEE COMPLETE 4+V*L*
4 series · 4 of 4 positions shown · non-contrast
Comparison: None.

CLINICAL DATA: Severe left knee pain.  No known injuries.

LEFT KNEE - COMPLETE 4+ VIEW 07/29/2011:

[t knee ap left]
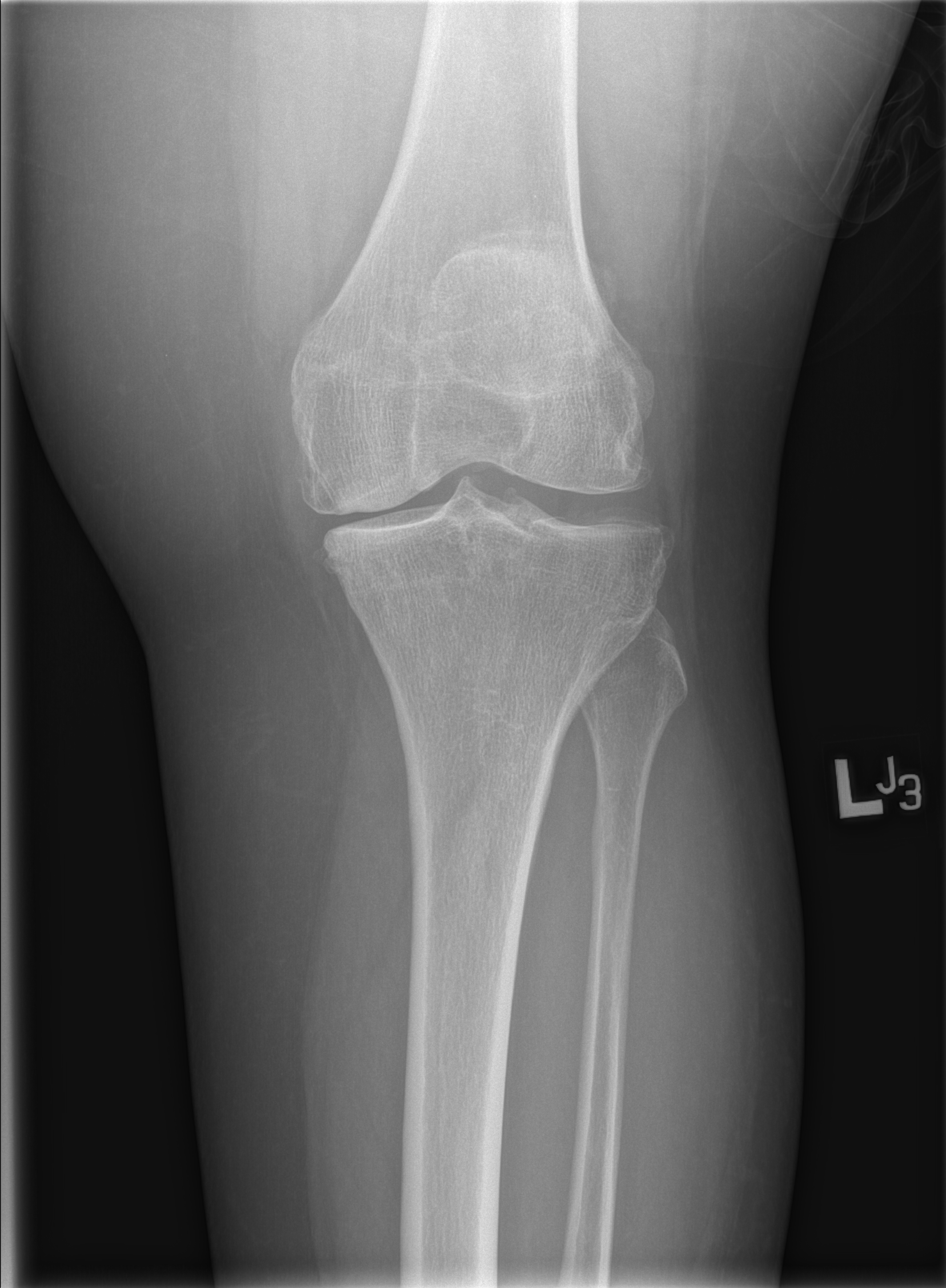

[t knee obl left (1 of 2)]
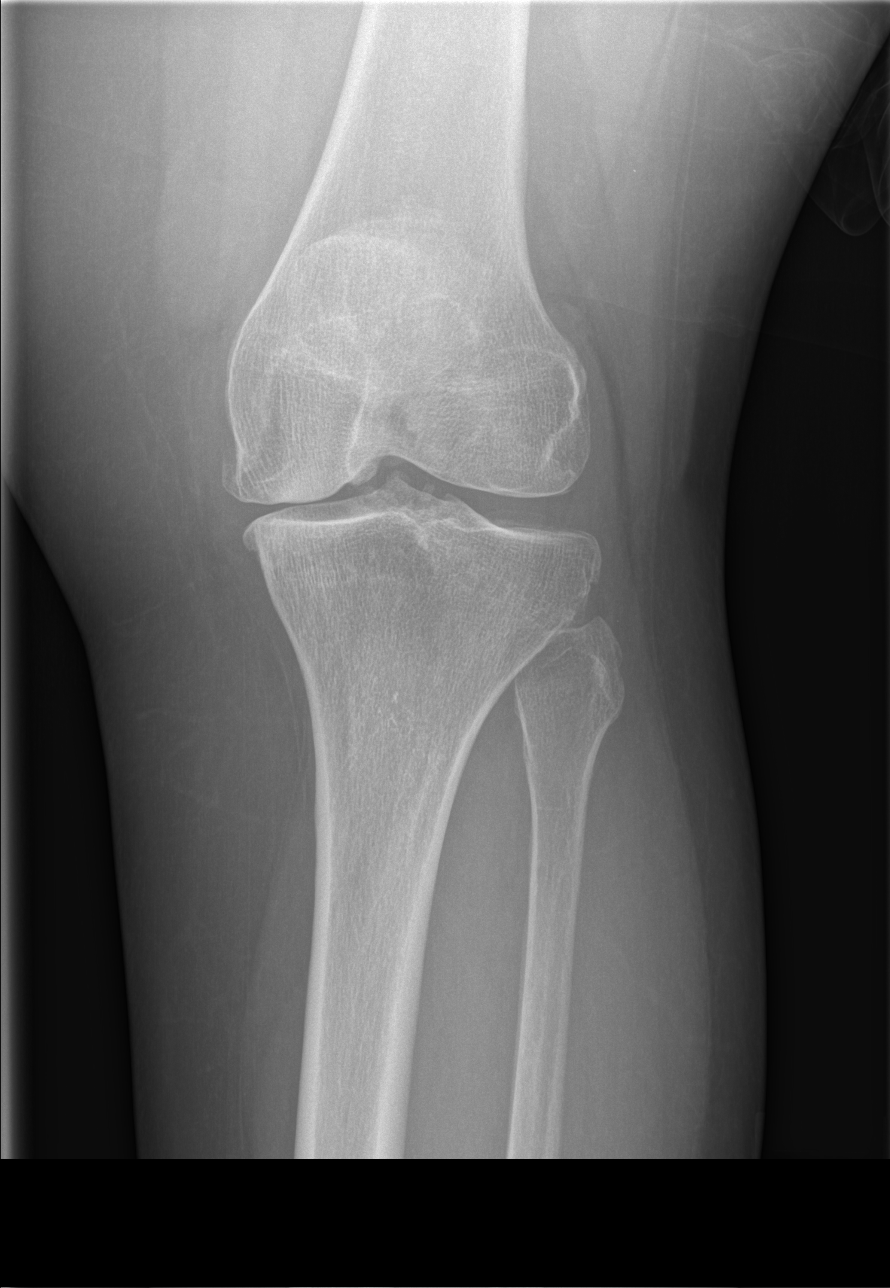

[t knee obl left (2 of 2)]
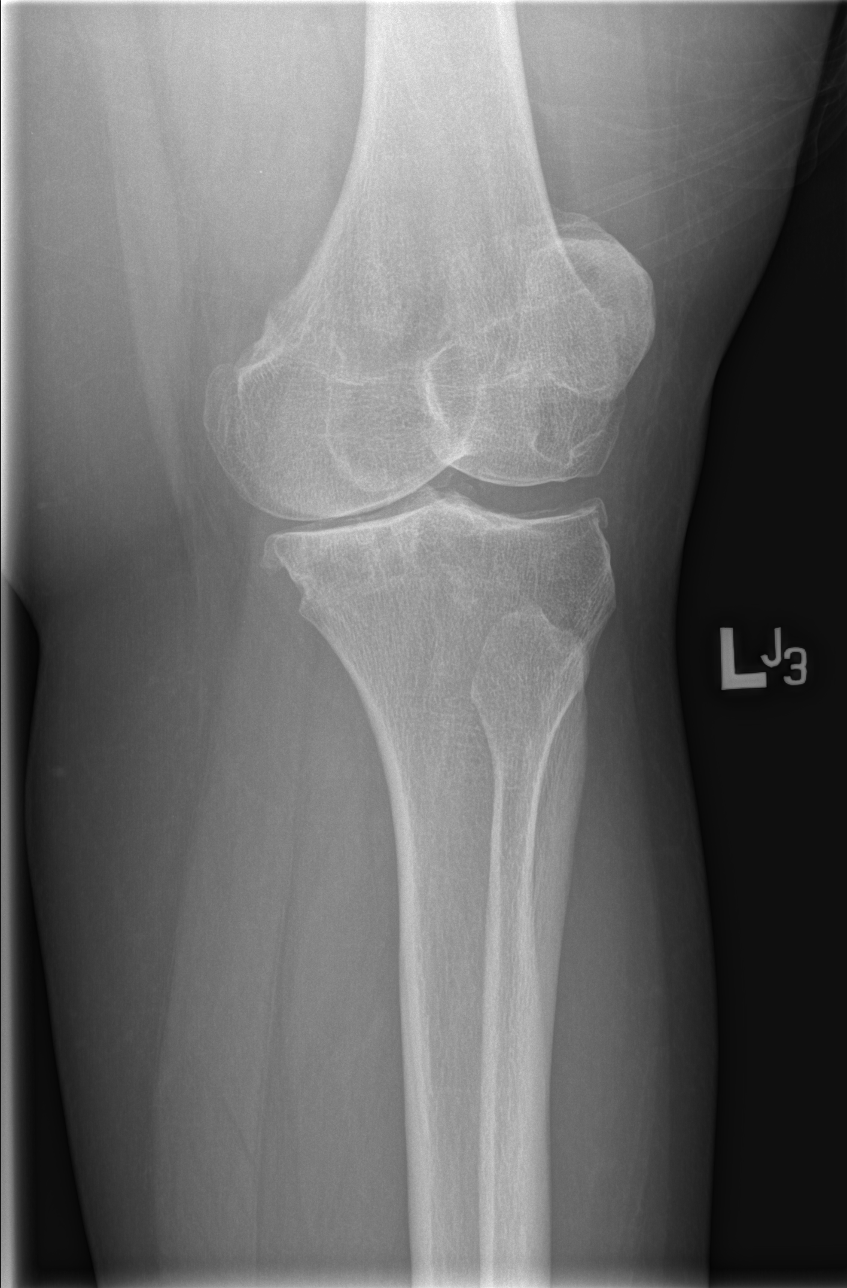

[t knee lat left]
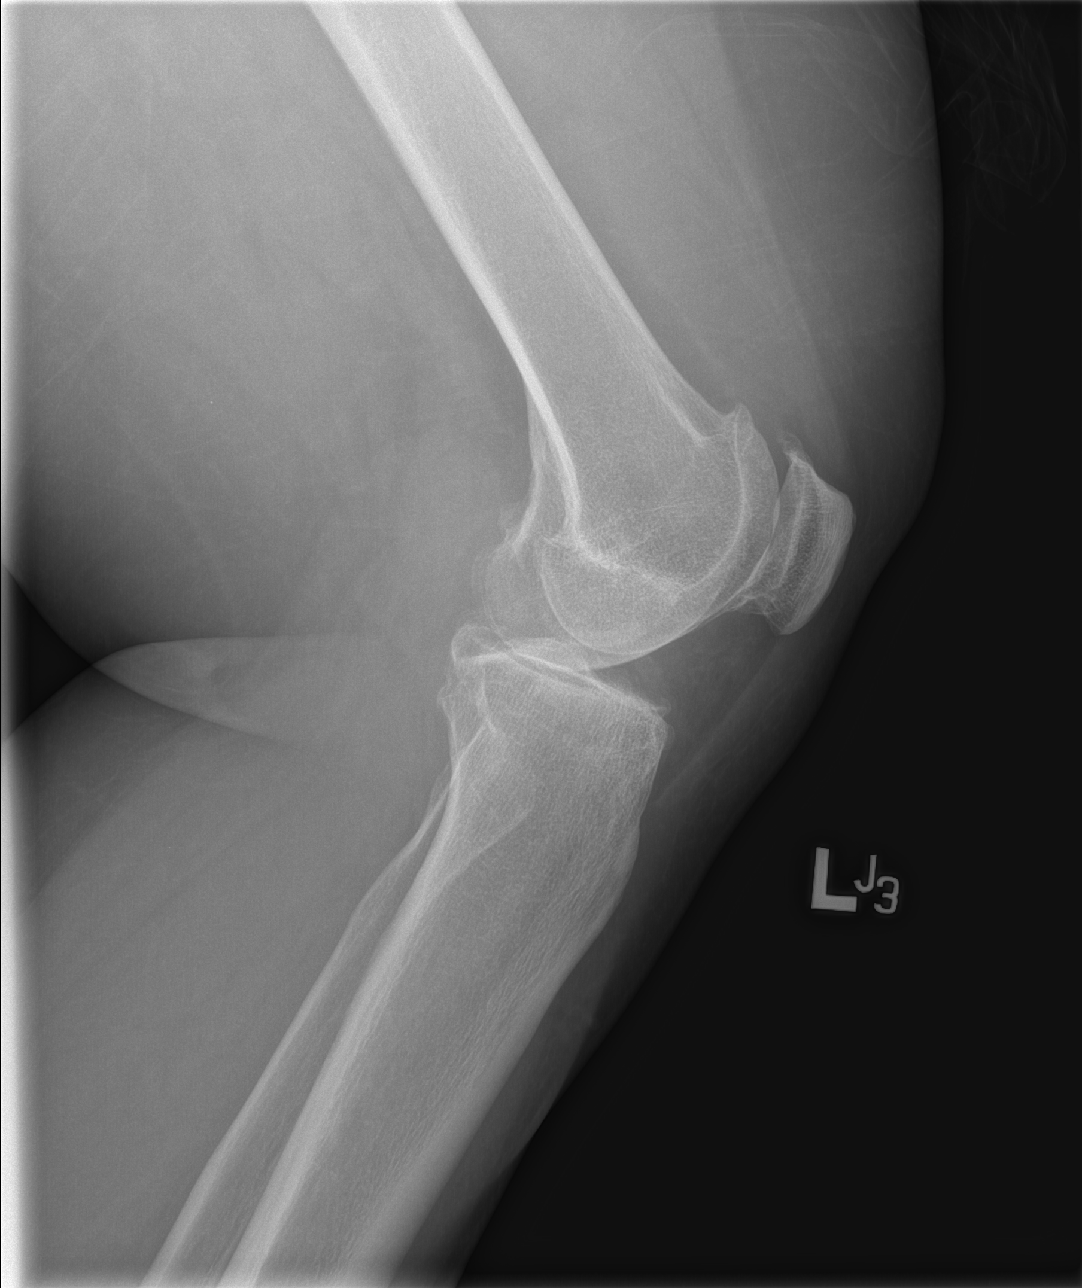

[4 of 4 positions shown; findings below may reference images not displayed]

FINDINGS: No evidence of acute, subacute, or healed fractures.
Moderate medial and patellofemoral compartment joint space
narrowing with associated hypertrophic spurring.  Lateral
compartment joint space well preserved with associated spurring.
No evidence of a significant joint effusion.
IMPRESSION: Tricompartment osteoarthritis, most severe in the medial and
patellofemoral compartments.

## 2012-12-05 ENCOUNTER — Emergency Department (HOSPITAL_BASED_OUTPATIENT_CLINIC_OR_DEPARTMENT_OTHER)
Admission: EM | Admit: 2012-12-05 | Discharge: 2012-12-05 | Disposition: A | Discharge status: Home / Self Care | Payer: Self-pay | Attending: Emergency Medicine | Admitting: Emergency Medicine

## 2012-12-05 ENCOUNTER — Encounter (HOSPITAL_BASED_OUTPATIENT_CLINIC_OR_DEPARTMENT_OTHER): Payer: Self-pay | Admitting: *Deleted

## 2012-12-05 DIAGNOSIS — I10 Essential (primary) hypertension: Secondary | ICD-10-CM | POA: Insufficient documentation

## 2012-12-05 DIAGNOSIS — R109 Unspecified abdominal pain: Secondary | ICD-10-CM

## 2012-12-05 DIAGNOSIS — G8929 Other chronic pain: Secondary | ICD-10-CM | POA: Insufficient documentation

## 2012-12-05 DIAGNOSIS — M129 Arthropathy, unspecified: Secondary | ICD-10-CM | POA: Insufficient documentation

## 2012-12-05 DIAGNOSIS — D259 Leiomyoma of uterus, unspecified: Secondary | ICD-10-CM | POA: Insufficient documentation

## 2012-12-05 DIAGNOSIS — Z79899 Other long term (current) drug therapy: Secondary | ICD-10-CM | POA: Insufficient documentation

## 2012-12-05 DIAGNOSIS — D219 Benign neoplasm of connective and other soft tissue, unspecified: Secondary | ICD-10-CM

## 2012-12-05 DIAGNOSIS — Z87891 Personal history of nicotine dependence: Secondary | ICD-10-CM | POA: Insufficient documentation

## 2012-12-05 DIAGNOSIS — R1032 Left lower quadrant pain: Secondary | ICD-10-CM | POA: Insufficient documentation

## 2012-12-05 DIAGNOSIS — Z8719 Personal history of other diseases of the digestive system: Secondary | ICD-10-CM | POA: Insufficient documentation

## 2012-12-05 MED ORDER — TRAMADOL HCL 50 MG PO TABS: 50.0000 mg | ORAL_TABLET | Freq: Four times a day (QID) | ORAL | Status: DC | PRN

## 2012-12-05 NOTE — ED Provider Notes (Signed)
CSN: [id number]     Arrival date & time 12/05/12  1120 History   First MD Initiated Contact with Patient 12/05/12 1141     Chief Complaint  Patient presents with  . Abdominal Pain   (Consider location/radiation/quality/duration/timing/severity/associated sxs/prior Treatment) HPI Comments: Patient is a 52 year old female with history of chronic abdominal pain related to fibroids and Dimitrios is. She presents today with complaints of suprapubic and left lower quadrant pain. This has been going on for many years. Tells that she has an appointment with [facility] the beginning of October but has run out of her tramadol. She is requesting more. She denies any she is having any fevers or chills. She denies any bowel or bladder complaints. There are no fevers.  Patient is a 52 y.o. female presenting with abdominal pain. The history is provided by the patient.  Abdominal Pain Pain location:  LLQ and suprapubic Pain quality: cramping   Pain radiates to:  Does not radiate Pain severity:  Moderate Onset quality:  Gradual Timing:  Constant Progression:  Worsening Chronicity:  Chronic Relieved by:  Nothing Worsened by:  Nothing tried Ineffective treatments: BC powder.   Past Medical History  Diagnosis Date  . Endometriosis   . Hiatal hernia   . Fibromyalgia   . Ovarian cyst   . Arthritis   . Hypertension   . Fibroid tumor    Past Surgical History  Procedure Laterality Date  . Tonsillectomy    . Appendectomy    . Laparoscopy     Family History  Problem Relation Age of Onset  . Kidney disease Mother   . Hypertension Mother    History  Substance Use Topics  . Smoking status: Former Games developer  . Smokeless tobacco: Never Used  . Alcohol Use: No   OB History   Grav Para Term Preterm Abortions TAB SAB Ect Mult Living   0 0 0 0 0 0 0 0 0 0      Review of Systems  Gastrointestinal: Positive for abdominal pain.  All other systems reviewed and are negative.    Allergies   Morphine and related and Rocephin  Home Medications   Current Outpatient Rx  Name  Route  Sig  Dispense  Refill  . Aspirin-Salicylamide-Caffeine (BC HEADACHE POWDER PO)   Oral   Take 4 Units by mouth 4 (four) times daily.          . carisoprodol (SOMA) 350 MG tablet   Oral   Take 1 tablet (350 mg total) by mouth 3 (three) times daily.   20 tablet   1   . lisinopril (PRINIVIL,ZESTRIL) 20 MG tablet   Oral   Take 1 tablet (20 mg total) by mouth daily.   30 tablet   0   . traMADol (ULTRAM) 50 MG tablet   Oral   Take 1 tablet (50 mg total) by mouth every 6 (six) hours as needed for pain.   20 tablet   0   . traMADol (ULTRAM) 50 MG tablet   Oral   Take 1 tablet (50 mg total) by mouth every 6 (six) hours as needed for pain.   15 tablet   0    BP 166/100  Pulse 115  Temp(Src) 98.2 F (36.8 C) (Oral)  Resp 20  Ht 5' 3.5" (1.613 m)  Wt 215 lb (97.523 kg)  BMI 37.48 kg/m2  SpO2 100% Physical Exam  Nursing note and vitals reviewed. Constitutional: She is oriented to person, place, and time. She appears  well-developed and well-nourished. No distress.  HENT:  Head: Normocephalic and atraumatic.  Mouth/Throat: Oropharynx is clear and moist.  Neck: Normal range of motion. Neck supple.  Cardiovascular: Normal rate, regular rhythm and normal heart sounds.   No murmur heard. Pulmonary/Chest: Effort normal and breath sounds normal. No respiratory distress. She has no wheezes.  Abdominal: Soft. Bowel sounds are normal. She exhibits no distension. There is tenderness.  There is tenderness to palpation in the left lower quadrant and suprapubic region. There is no rebound and no guarding. Bowel sounds are normoactive. There are no masses.  Musculoskeletal: Normal range of motion. She exhibits no edema.  Neurological: She is alert and oriented to person, place, and time.  Skin: Skin is warm and dry. She is not diaphoretic.    ED Course  Procedures (including critical care  time) Labs Review Labs Reviewed - No data to display Imaging Review No results found.  MDM  No diagnosis found. Patient presents here with complaints of an exacerbation of her chronic abdominal pain and running out of her pain medication. She normally takes tramadol for this however she took her last pill 3 days ago. She has tried taking BC powder this morning but is not having any relief. Her exam reveals tenderness in the left lower quadrant however there is no rebound and no guarding. There is no fever no bowel complaints that make me feel as though this is diverticulitis. She has no history of this. She tells me this pain is similar to what she has experienced in the past with her chronic problems. I do not feel as though a workup is indicated. I have advised her I will prescribe a limited number of tramadol for her, but have advised her this will likely be the last time I do this for her chronic condition. She is due to see a GYN beginning of the month. She asked me about referrals to GYN's however I've advised her to call and have her appointment moved up she feels like she cannot wait that long.    Geoffery Lyons, MD 12/05/12 [phone]

## 2012-12-05 NOTE — ED Notes (Signed)
Patient states that she has abd pain and a fibroid tumor. States that she has run out of pain medication.

## 2012-12-21 ENCOUNTER — Emergency Department (INDEPENDENT_AMBULATORY_CARE_PROVIDER_SITE_OTHER)
Admission: EM | Admit: 2012-12-21 | Discharge: 2012-12-21 | Disposition: A | Discharge status: Home / Self Care | Payer: Self-pay | Source: Home / Self Care | Attending: Emergency Medicine | Admitting: Emergency Medicine

## 2012-12-21 ENCOUNTER — Encounter (HOSPITAL_COMMUNITY): Payer: Self-pay | Admitting: Emergency Medicine

## 2012-12-21 DIAGNOSIS — R109 Unspecified abdominal pain: Secondary | ICD-10-CM

## 2012-12-21 DIAGNOSIS — I10 Essential (primary) hypertension: Secondary | ICD-10-CM

## 2012-12-21 DIAGNOSIS — G8929 Other chronic pain: Secondary | ICD-10-CM

## 2012-12-21 MED ORDER — LISINOPRIL 20 MG PO TABS: 20.0000 mg | ORAL_TABLET | Freq: Every day | ORAL | Status: DC

## 2012-12-21 MED ORDER — TRAMADOL HCL 50 MG PO TABS: 50.0000 mg | ORAL_TABLET | Freq: Four times a day (QID) | ORAL | Status: DC | PRN

## 2012-12-21 NOTE — ED Notes (Signed)
Out of her pain Rx ; has reported fibroid tumor

## 2012-12-21 NOTE — ED Provider Notes (Signed)
CSN: [id number]     Arrival date & time 12/21/12  1706 History   First MD Initiated Contact with Patient 12/21/12 1920     Chief Complaint  Patient presents with  . Medication Refill   (Consider location/radiation/quality/duration/timing/severity/associated sxs/prior Treatment) HPI Comments: Pt reports fibroid dx this past summer that causes her pain in LLQ of abd. This pain is not new or changed from usual pain. Pt requests tramadol for pain. She has been on tramadol "for years" for knee pain and she finds it works well for abd pain. 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 Hx HTN, "lost" her bp medicine about a month ago, has not been back to pcp for refill of lisinopril because "I don't like her".    Patient is a 52 y.o. female presenting with abdominal pain. The history is provided by the patient.  Abdominal Pain This is a chronic problem. Episode onset: several months ago. The problem occurs daily. The problem has not changed since onset.Associated symptoms include abdominal pain. Pertinent negatives include no chest pain and no shortness of breath. Nothing aggravates the symptoms. Relieved by: tramadol. She has tried ASA for the symptoms. The treatment provided no relief.    Past Medical History  Diagnosis Date  . Endometriosis   . Hiatal hernia   . Fibromyalgia   . Ovarian cyst   . Arthritis   . Hypertension   . Fibroid tumor    Past Surgical History  Procedure Laterality Date  . Tonsillectomy    . Appendectomy    . Laparoscopy     Family History  Problem Relation Age of Onset  . Kidney disease Mother   . Hypertension Mother    History  Substance Use Topics  . Smoking status: Former Games developer  . Smokeless tobacco: Never Used  . Alcohol Use: No   OB History   Grav Para Term Preterm Abortions TAB SAB Ect Mult Living   0 0 0 0 0 0 0 0 0 0      Review of Systems   Constitutional: Negative for fever and chills.  Respiratory: Negative for shortness of breath.   Cardiovascular: Negative for chest pain.  Gastrointestinal: Positive for abdominal pain. Negative for nausea, vomiting and blood in stool.  Genitourinary: Positive for vaginal bleeding.    Allergies  Morphine and related and Rocephin  Home Medications   Current Outpatient Rx  Name  Route  Sig  Dispense  Refill  . Aspirin-Salicylamide-Caffeine (BC HEADACHE POWDER PO)   Oral   Take 4 Units by mouth 4 (four) times daily.          . carisoprodol (SOMA) 350 MG tablet   Oral   Take 1 tablet (350 mg total) by mouth 3 (three) times daily.   20 tablet   1   . lisinopril (PRINIVIL,ZESTRIL) 20 MG tablet   Oral   Take 1 tablet (20 mg total) by mouth daily.   30 tablet   0   . lisinopril (PRINIVIL,ZESTRIL) 20 MG tablet   Oral   Take 1 tablet (20 mg total) by mouth daily.   30 tablet   1   . traMADol (ULTRAM) 50 MG tablet   Oral   Take 1 tablet (50 mg total) by mouth every 6 (six) hours as needed for pain.   20 tablet   0   . traMADol (ULTRAM) 50 MG  tablet   Oral   Take 1 tablet (50 mg total) by mouth every 6 (six) hours as needed for pain.   15 tablet   0   . traMADol (ULTRAM) 50 MG tablet   Oral   Take 1 tablet (50 mg total) by mouth every 6 (six) hours as needed for pain.   15 tablet   0   . traMADol (ULTRAM) 50 MG tablet   Oral   Take 1 tablet (50 mg total) by mouth every 6 (six) hours as needed for pain.   6 tablet   0    BP 174/114  Pulse 115  Temp(Src) 98.7 F (37.1 C) (Oral)  Resp 20  SpO2 100% Physical Exam  Constitutional: She appears well-developed and well-nourished. No distress.  Cardiovascular: Normal rate and regular rhythm.   Pulmonary/Chest: Effort normal and breath sounds normal.  Abdominal: Soft. Bowel sounds are normal. She exhibits no distension. There is tenderness in the left lower quadrant. There is no rigidity, no rebound, no guarding  and no CVA tenderness.  Very mild tenderness to palp in LLQ.     ED Course  Procedures (including critical care time) Labs Review Labs Reviewed - No data to display Imaging Review No results found.  EKG Interpretation     Ventricular Rate:    PR Interval:    QRS Duration:   QT Interval:    QTC Calculation:   R Axis:     Text Interpretation:              MDM   1. Hypertension   2. Chronic abdominal pain   Discussed with pt she may not come here for refills of tramadol again. Rx tramadol 50mg  q6 hours prn pain #6. [facility]/fibroid pain. Pt states she will call them tomorrow. Rx lisinopril 20mg  daily #30, 1 refill. Referred pt to [facility] for f/u care of bp and other health needs.     Cathlyn Parsons, NP 12/21/12 1945

## 2012-12-21 NOTE — ED Provider Notes (Signed)
Medical screening examination/treatment/procedure(s) were performed by non-physician practitioner and as supervising physician I was immediately available for consultation/collaboration.  Leslee Home, M.D.   Reuben Likes, MD 12/21/12 2121

## 2012-12-23 ENCOUNTER — Inpatient Hospital Stay (HOSPITAL_COMMUNITY)
Admission: AD | Admit: 2012-12-23 | Discharge: 2012-12-23 | Disposition: A | Discharge status: Home / Self Care | Payer: Self-pay | Source: Ambulatory Visit | Attending: Obstetrics & Gynecology | Admitting: Obstetrics & Gynecology

## 2012-12-23 ENCOUNTER — Encounter (HOSPITAL_COMMUNITY): Payer: Self-pay | Admitting: *Deleted

## 2012-12-23 DIAGNOSIS — G8929 Other chronic pain: Secondary | ICD-10-CM | POA: Diagnosis present

## 2012-12-23 DIAGNOSIS — R109 Unspecified abdominal pain: Secondary | ICD-10-CM | POA: Insufficient documentation

## 2012-12-23 DIAGNOSIS — D259 Leiomyoma of uterus, unspecified: Secondary | ICD-10-CM | POA: Insufficient documentation

## 2012-12-23 DIAGNOSIS — N809 Endometriosis, unspecified: Secondary | ICD-10-CM | POA: Diagnosis present

## 2012-12-23 DIAGNOSIS — N949 Unspecified condition associated with female genital organs and menstrual cycle: Secondary | ICD-10-CM | POA: Insufficient documentation

## 2012-12-23 LAB — URINALYSIS, ROUTINE W REFLEX MICROSCOPIC
Nitrite: NEGATIVE
Specific Gravity, Urine: <1.005 — ABNORMAL LOW (ref 1.005–1.030)
Urobilinogen, UA: 0.2 mg/dL (ref 0.0–1.0)

## 2012-12-23 LAB — URINE MICROSCOPIC-ADD ON

## 2012-12-23 LAB — POCT PREGNANCY, URINE: Preg Test, Ur: NEGATIVE

## 2012-12-23 LAB — CBC
HCT: 42.8 % (ref 36.0–46.0)
RDW: 14 % (ref 11.5–15.5)
WBC: 10.6 10*3/uL — ABNORMAL HIGH (ref 4.0–10.5)

## 2012-12-23 MED ORDER — TRAMADOL HCL 50 MG PO TABS
50.0000 mg | ORAL_TABLET | ORAL | Status: AC
  Administered 2012-12-23: 50 mg via ORAL
  Filled 2012-12-23: qty 1

## 2012-12-23 MED ORDER — TRAMADOL HCL 50 MG PO TABS: 50.0000 mg | ORAL_TABLET | Freq: Four times a day (QID) | ORAL | Status: DC | PRN

## 2012-12-23 NOTE — MAU Note (Signed)
Patient states she has recently been diagnosed with a uterine fibroid. States she has had irregular periods, about every 6 months, for a while. States she has had a lot of abdominal pain and bleeding. Started bleeding about 3 days ago but the pain is constant.

## 2012-12-23 NOTE — MAU Provider Note (Signed)
Chief Complaint: Abdominal Pain and Vaginal Bleeding   First Provider Initiated Contact with Patient 12/23/12 1756     SUBJECTIVE HPI: Debra [name] is a 52 y.o. G0P0000 at Unknown by LMP who presents to maternity admissions reporting abdominal pain described as sharp, lower abdominal constant pain that radiates to her lower back.  She has had this pain several years.  She was treated for endometriosis several years ago and had laproscopic surgery with removal of scar tissue which helped her pain.  She was recently diagnosed with fibroids and thinks this may be adding to her pain.  She does not have insurance and has not had a primary care provider in a while.  She has an appointment in Gyn clinic on 01/13/13 with Dr Erin Fulling to talk about treatment options for her pelvic pain.  She was given Tramadol x6 tablets in the ED 10/13 but is tearful and reports this will not be enough for her pain.  She takes BC powder daily when out of Tramadol and reports that ibuprofen is helpful but makes her acid reflux worse.  She reports vaginal bleeding today consistent with normal menses, denies vaginal itching/burning, urinary symptoms, h/a, dizziness, n/v, or fever/chills.    Past Medical History  Diagnosis Date  . Endometriosis   . Hiatal hernia   . Fibromyalgia   . Ovarian cyst   . Arthritis   . Hypertension   . Fibroid tumor    Past Surgical History  Procedure Laterality Date  . Tonsillectomy    . Appendectomy    . Laparoscopy     History   Social History  . Marital Status: Legally Separated    Spouse Name: N/A    Number of Children: N/A  . Years of Education: N/A   Occupational History  . Not on file.   Social History Main Topics  . Smoking status: Former Games developer  . Smokeless tobacco: Never Used  . Alcohol Use: No  . Drug Use: No  . Sexual Activity: Not on file   Other Topics Concern  . Not on file   Social History Narrative  . No narrative on file   No current  facility-administered medications on file prior to encounter.   Current Outpatient Prescriptions on File Prior to Encounter  Medication Sig Dispense Refill  . Aspirin-Salicylamide-Caffeine (BC HEADACHE POWDER PO) Take 4 Units by mouth 4 (four) times daily.       . carisoprodol (SOMA) 350 MG tablet Take 1 tablet (350 mg total) by mouth 3 (three) times daily.  20 tablet  1  . lisinopril (PRINIVIL,ZESTRIL) 20 MG tablet Take 1 tablet (20 mg total) by mouth daily.  30 tablet  0  . traMADol (ULTRAM) 50 MG tablet Take 1 tablet (50 mg total) by mouth every 6 (six) hours as needed for pain.  20 tablet  0   Allergies  Allergen Reactions  . Morphine And Related Hives and Rash  . Rocephin [Ceftriaxone Sodium In Dextrose] Hives and Rash    ROS: Pertinent items in HPI  OBJECTIVE Blood pressure 147/99, pulse 117, resp. rate 20, height 5\' 4"  (1.626 m), weight 101.334 kg (223 lb 6.4 oz), SpO2 100.00%. GENERAL: Well-developed, well-nourished female in no acute distress.  HEENT: Normocephalic HEART: normal rate RESP: normal effort ABDOMEN: Soft, non-tender EXTREMITIES: Nontender, no edema NEURO: Alert and oriented SPECULUM EXAM: Deferred--done 10/13 at [facility] ED LAB RESULTS Results for orders placed during the hospital encounter of 12/23/12 (from the past 24 hour(s))  URINALYSIS, ROUTINE W  REFLEX MICROSCOPIC     Status: Abnormal   Collection Time    12/23/12  4:10 PM      Result Value Range   Color, Urine YELLOW  YELLOW   APPearance CLEAR  CLEAR   Specific Gravity, Urine <1.005 (*) 1.005 - 1.030   pH 6.0  5.0 - 8.0   Glucose, UA NEGATIVE  NEGATIVE mg/dL   Hgb urine dipstick TRACE (*) NEGATIVE   Bilirubin Urine NEGATIVE  NEGATIVE   Ketones, ur NEGATIVE  NEGATIVE mg/dL   Protein, ur NEGATIVE  NEGATIVE mg/dL   Urobilinogen, UA 0.2  0.0 - 1.0 mg/dL   Nitrite NEGATIVE  NEGATIVE   Leukocytes, UA NEGATIVE  NEGATIVE  URINE MICROSCOPIC-ADD ON     Status: None   Collection Time    12/23/12  4:10 PM       Result Value Range   Squamous Epithelial / LPF RARE  RARE  CBC     Status: Abnormal   Collection Time    12/23/12  4:19 PM      Result Value Range   WBC 10.6 (*) 4.0 - 10.5 K/uL   RBC 4.89  3.87 - 5.11 MIL/uL   Hemoglobin 14.5  12.0 - 15.0 g/dL   HCT 14.7  82.9 - 56.2 %   MCV 87.5  78.0 - 100.0 fL   MCH 29.7  26.0 - 34.0 pg   MCHC 33.9  30.0 - 36.0 g/dL   RDW 13.0  86.5 - 78.4 %   Platelets 415 (*) 150 - 400 K/uL  POCT PREGNANCY, URINE     Status: None   Collection Time    12/23/12  4:22 PM      Result Value Range   Preg Test, Ur NEGATIVE  NEGATIVE    ASSESSMENT 1. Chronic pelvic pain in female   2. Fibroid uterus   3. Endometriosis     PLAN Discharge home Keep scheduled appointment in Gyn clinic Warm bath, heating pad, ibuprofen as tolerated for pain Tramadol 50 mg Q 6 hours x30 tablets Discussed with pt that she will not be able to renew narcotic prescription until clinic visit Recommend d/c BC powder for gyn pain/vaginal bleeding Return to MAU as needed    Medication List    ASK your doctor about these medications       BC HEADACHE POWDER PO  Take 4 Units by mouth 4 (four) times daily.     carisoprodol 350 MG tablet  Commonly known as:  SOMA  Take 1 tablet (350 mg total) by mouth 3 (three) times daily.     ibuprofen 200 MG tablet  Commonly known as:  ADVIL,MOTRIN  Take 400 mg by mouth every 6 (six) hours as needed for pain.     lisinopril 20 MG tablet  Commonly known as:  PRINIVIL,ZESTRIL  Take 1 tablet (20 mg total) by mouth daily.     traMADol 50 MG tablet  Commonly known as:  ULTRAM  Take 1 tablet (50 mg total) by mouth every 6 (six) hours as needed for pain.         Sharen Counter Certified Nurse-Midwife 12/23/2012  5:57 PM

## 2012-12-23 NOTE — MAU Note (Signed)
Pt states she is having very bad fibroid pain. Pt states she has had some very bad pain.

## 2012-12-31 ENCOUNTER — Ambulatory Visit: Payer: Self-pay

## 2013-01-13 ENCOUNTER — Encounter: Payer: Self-pay | Admitting: Obstetrics & Gynecology

## 2013-02-25 ENCOUNTER — Encounter (HOSPITAL_BASED_OUTPATIENT_CLINIC_OR_DEPARTMENT_OTHER): Payer: Self-pay | Admitting: Emergency Medicine

## 2013-02-25 ENCOUNTER — Emergency Department (HOSPITAL_BASED_OUTPATIENT_CLINIC_OR_DEPARTMENT_OTHER)
Admission: EM | Admit: 2013-02-25 | Discharge: 2013-02-25 | Disposition: A | Discharge status: Home / Self Care | Payer: Self-pay | Attending: Emergency Medicine | Admitting: Emergency Medicine

## 2013-02-25 DIAGNOSIS — Z3202 Encounter for pregnancy test, result negative: Secondary | ICD-10-CM | POA: Insufficient documentation

## 2013-02-25 DIAGNOSIS — Z8742 Personal history of other diseases of the female genital tract: Secondary | ICD-10-CM | POA: Insufficient documentation

## 2013-02-25 DIAGNOSIS — G8929 Other chronic pain: Secondary | ICD-10-CM | POA: Insufficient documentation

## 2013-02-25 DIAGNOSIS — M129 Arthropathy, unspecified: Secondary | ICD-10-CM | POA: Insufficient documentation

## 2013-02-25 DIAGNOSIS — Z87891 Personal history of nicotine dependence: Secondary | ICD-10-CM | POA: Insufficient documentation

## 2013-02-25 DIAGNOSIS — IMO0001 Reserved for inherently not codable concepts without codable children: Secondary | ICD-10-CM | POA: Insufficient documentation

## 2013-02-25 DIAGNOSIS — Z79899 Other long term (current) drug therapy: Secondary | ICD-10-CM | POA: Insufficient documentation

## 2013-02-25 DIAGNOSIS — Z8719 Personal history of other diseases of the digestive system: Secondary | ICD-10-CM | POA: Insufficient documentation

## 2013-02-25 DIAGNOSIS — R1032 Left lower quadrant pain: Secondary | ICD-10-CM | POA: Insufficient documentation

## 2013-02-25 DIAGNOSIS — I10 Essential (primary) hypertension: Secondary | ICD-10-CM | POA: Insufficient documentation

## 2013-02-25 LAB — URINALYSIS, ROUTINE W REFLEX MICROSCOPIC
Bilirubin Urine: NEGATIVE
Ketones, ur: NEGATIVE mg/dL
Nitrite: NEGATIVE
Protein, ur: NEGATIVE mg/dL
Specific Gravity, Urine: 1.017 (ref 1.005–1.030)
Urobilinogen, UA: 0.2 mg/dL (ref 0.0–1.0)

## 2013-02-25 MED ORDER — TRAMADOL HCL 50 MG PO TABS: 50.0000 mg | ORAL_TABLET | Freq: Four times a day (QID) | ORAL | Status: DC | PRN

## 2013-02-25 NOTE — ED Provider Notes (Signed)
CSN: [id number]     Arrival date & time 02/25/13  1439 History   First MD Initiated Contact with Patient 02/25/13 1454     Chief Complaint  Patient presents with  . Abdominal Pain   (Consider location/radiation/quality/duration/timing/severity/associated sxs/prior Treatment) Patient is a 52 y.o. female presenting with abdominal pain. The history is provided by the patient. No language interpreter was used.  Abdominal Pain Pain location:  LLQ Associated symptoms: no chills, no constipation, no diarrhea, no dysuria, no fever, no nausea, no vaginal discharge and no vomiting   Associated symptoms comment:  She complains of pain in the left lower abdomen that radiates into her back. She has had the same pain for "a long time" and relates it to her fibroids. She is usually well controlled on Ultram but reports she ran out of this medication yesterday. She denies change in bowel movements, any nausea or vomiting, vaginal discharge or fever. She states there is nothing new or different about the pain she is having today.    Past Medical History  Diagnosis Date  . Endometriosis   . Hiatal hernia   . Fibromyalgia   . Ovarian cyst   . Arthritis   . Hypertension   . Fibroid tumor    Past Surgical History  Procedure Laterality Date  . Tonsillectomy    . Appendectomy    . Laparoscopy     Family History  Problem Relation Age of Onset  . Kidney disease Mother   . Hypertension Mother    History  Substance Use Topics  . Smoking status: Former Games developer  . Smokeless tobacco: Never Used  . Alcohol Use: No   OB History   Grav Para Term Preterm Abortions TAB SAB Ect Mult Living   0 0 0 0 0 0 0 0 0 0      Review of Systems  Constitutional: Negative for fever and chills.  Cardiovascular: Negative.   Gastrointestinal: Positive for abdominal pain. Negative for nausea, vomiting, diarrhea and constipation.  Genitourinary: Negative.  Negative for dysuria and vaginal discharge.  Musculoskeletal:  Negative.   Skin: Negative.   Neurological: Negative.     Allergies  Morphine and related and Rocephin  Home Medications   Current Outpatient Rx  Name  Route  Sig  Dispense  Refill  . carisoprodol (SOMA) 350 MG tablet   Oral   Take 1 tablet (350 mg total) by mouth 3 (three) times daily.   20 tablet   1   . ibuprofen (ADVIL,MOTRIN) 200 MG tablet   Oral   Take 400 mg by mouth every 6 (six) hours as needed for pain.         Marland Kitchen lisinopril (PRINIVIL,ZESTRIL) 20 MG tablet   Oral   Take 1 tablet (20 mg total) by mouth daily.   30 tablet   0   . traMADol (ULTRAM) 50 MG tablet   Oral   Take 1 tablet (50 mg total) by mouth every 6 (six) hours as needed for pain.   20 tablet   0   . traMADol (ULTRAM) 50 MG tablet   Oral   Take 1 tablet (50 mg total) by mouth every 6 (six) hours as needed for pain.   30 tablet   0    BP 162/97  Pulse 98  Temp(Src) 98.8 F (37.1 C) (Oral)  Resp 18  Ht 5\' 3"  (1.6 m)  Wt 215 lb (97.523 kg)  BMI 38.09 kg/m2  SpO2 100% Physical Exam  Constitutional: She is  oriented to person, place, and time. She appears well-developed and well-nourished.  HENT:  Head: Normocephalic.  Neck: Normal range of motion. Neck supple.  Pulmonary/Chest: Effort normal.  Abdominal: Soft. Bowel sounds are normal. There is tenderness. There is no rebound and no guarding.  Tenderness in LLQ without mass. She is moderately guarding. Abdomen is soft.  Musculoskeletal: Normal range of motion.  Neurological: She is alert and oriented to person, place, and time.  Skin: Skin is warm and dry. No rash noted.  Psychiatric: She has a normal mood and affect.    ED Course  Procedures (including critical care time) Labs Review Labs Reviewed  URINALYSIS, ROUTINE W REFLEX MICROSCOPIC  PREGNANCY, URINE   Imaging Review No results found.  EKG Interpretation   None       MDM  No diagnosis found. 1. Chronic abdominal pain 2. History of uterine fibroids  Discussed  with Dr. Jodi Mourning, who has seen the patient. No change in chronic pain symptoms. VSS. Urinalysis is negative. Suspect pain is due to chronic pain related to fibroids. Will refill Ultram and encourage GYN follow up, which she is working on getting in place.    Arnoldo Hooker, PA-C 02/25/13 1542

## 2013-02-25 NOTE — ED Provider Notes (Signed)
Medical screening examination/treatment/procedure(s) were conducted as a shared visit with non-physician practitioner(s) or resident and myself. I personally evaluated the patient during the encounter and agree with the findings and plan unless otherwise indicated.  I have personally reviewed any xrays and/ or EKG's with the provider and I agree with interpretation.  LLQ pain intermittent for 6 months, similar to previous fibroid pain for which she had an [place]. No diverticular hx. Financial difficulty, pt was seeing GYN in [place] and [facility] however cannot afford. She is currently being set up for [facility]. Exam mild LLQ pain, no guarding, well appearing. No distress. RRR. Strict reasons to return given.  LLQ pain, Fibroid hx.    Enid Skeens, MD 02/25/13 [phone]

## 2013-02-25 NOTE — ED Notes (Signed)
Pt c/o left lower abd pain  X 1 week with hx of same

## 2013-03-02 ENCOUNTER — Encounter (HOSPITAL_BASED_OUTPATIENT_CLINIC_OR_DEPARTMENT_OTHER): Payer: Self-pay | Admitting: Emergency Medicine

## 2013-03-02 ENCOUNTER — Emergency Department (HOSPITAL_BASED_OUTPATIENT_CLINIC_OR_DEPARTMENT_OTHER): Payer: Self-pay

## 2013-03-02 ENCOUNTER — Emergency Department (HOSPITAL_BASED_OUTPATIENT_CLINIC_OR_DEPARTMENT_OTHER)
Admission: EM | Admit: 2013-03-02 | Discharge: 2013-03-02 | Disposition: A | Discharge status: Home / Self Care | Payer: Self-pay | Attending: Emergency Medicine | Admitting: Emergency Medicine

## 2013-03-02 DIAGNOSIS — R109 Unspecified abdominal pain: Secondary | ICD-10-CM | POA: Insufficient documentation

## 2013-03-02 DIAGNOSIS — Z8719 Personal history of other diseases of the digestive system: Secondary | ICD-10-CM | POA: Insufficient documentation

## 2013-03-02 DIAGNOSIS — J069 Acute upper respiratory infection, unspecified: Secondary | ICD-10-CM | POA: Insufficient documentation

## 2013-03-02 DIAGNOSIS — IMO0001 Reserved for inherently not codable concepts without codable children: Secondary | ICD-10-CM | POA: Insufficient documentation

## 2013-03-02 DIAGNOSIS — G8929 Other chronic pain: Secondary | ICD-10-CM | POA: Insufficient documentation

## 2013-03-02 DIAGNOSIS — Z8742 Personal history of other diseases of the female genital tract: Secondary | ICD-10-CM | POA: Insufficient documentation

## 2013-03-02 DIAGNOSIS — Z79899 Other long term (current) drug therapy: Secondary | ICD-10-CM | POA: Insufficient documentation

## 2013-03-02 DIAGNOSIS — I10 Essential (primary) hypertension: Secondary | ICD-10-CM | POA: Insufficient documentation

## 2013-03-02 DIAGNOSIS — H9209 Otalgia, unspecified ear: Secondary | ICD-10-CM | POA: Insufficient documentation

## 2013-03-02 DIAGNOSIS — M129 Arthropathy, unspecified: Secondary | ICD-10-CM | POA: Insufficient documentation

## 2013-03-02 DIAGNOSIS — R0982 Postnasal drip: Secondary | ICD-10-CM | POA: Insufficient documentation

## 2013-03-02 DIAGNOSIS — Z87891 Personal history of nicotine dependence: Secondary | ICD-10-CM | POA: Insufficient documentation

## 2013-03-02 MED ORDER — TRAMADOL HCL 50 MG PO TABS: 50.0000 mg | ORAL_TABLET | Freq: Four times a day (QID) | ORAL | Status: DC | PRN

## 2013-03-02 MED ORDER — KETOROLAC TROMETHAMINE 60 MG/2ML IM SOLN
60.0000 mg | Freq: Once | INTRAMUSCULAR | Status: AC
  Administered 2013-03-02: 60 mg via INTRAMUSCULAR
  Filled 2013-03-02: qty 2

## 2013-03-02 MED ORDER — BENZONATATE 100 MG PO CAPS: 100.0000 mg | ORAL_CAPSULE | Freq: Three times a day (TID) | ORAL | Status: DC | PRN

## 2013-03-02 NOTE — ED Notes (Signed)
MD at bedside. 

## 2013-03-02 NOTE — ED Notes (Signed)
Pt reports cough, congestion, ear pain and sore throat that started Saturday.

## 2013-03-02 NOTE — ED Provider Notes (Signed)
CSN: [id number]     Arrival date & time 03/02/13  4098 History   First MD Initiated Contact with Patient 03/02/13 0744     Chief Complaint  Patient presents with  . Cough  . Nasal Congestion  . Otalgia  . Sore Throat   (Consider location/radiation/quality/duration/timing/severity/associated sxs/prior Treatment) HPI Comments: Patient presents with cough and chest congestion. She states the symptoms started about 4 days ago with runny nose and congestion that started to her chest. She's coughing up some yellow sputum in the morning. She has a MAXIMUM TEMPERATURE of 98.8. She's had no nausea vomiting or diarrhea. She denies any shortness of breath. She has a mild sore throat. She also complains of chronic abdominal pain is unchanged from her baseline. It's in her left flank area. She normally takes tramadol for the pain. She has an upcoming appointment next month to followup with the [facility].  Patient is a 52 y.o. female presenting with cough, ear pain, and pharyngitis.  Cough Associated symptoms: ear pain, myalgias, rhinorrhea and sore throat   Associated symptoms: no chest pain, no chills, no diaphoresis, no fever, no headaches, no rash and no shortness of breath   Otalgia Associated symptoms: abdominal pain, congestion, cough, rhinorrhea and sore throat   Associated symptoms: no diarrhea, no fever, no headaches, no rash and no vomiting   Sore Throat Associated symptoms include abdominal pain. Pertinent negatives include no chest pain, no headaches and no shortness of breath.    Past Medical History  Diagnosis Date  . Endometriosis   . Hiatal hernia   . Fibromyalgia   . Ovarian cyst   . Arthritis   . Hypertension   . Fibroid tumor    Past Surgical History  Procedure Laterality Date  . Tonsillectomy    . Appendectomy    . Laparoscopy     Family History  Problem Relation Age of Onset  . Kidney disease Mother   . Hypertension Mother    History  Substance  Use Topics  . Smoking status: Former Games developer  . Smokeless tobacco: Never Used  . Alcohol Use: No   OB History   Grav Para Term Preterm Abortions TAB SAB Ect Mult Living   0 0 0 0 0 0 0 0 0 0      Review of Systems  Constitutional: Negative for fever, chills, diaphoresis and fatigue.  HENT: Positive for congestion, ear pain, postnasal drip, rhinorrhea, sinus pressure, sneezing and sore throat.   Eyes: Negative.   Respiratory: Positive for cough. Negative for chest tightness and shortness of breath.   Cardiovascular: Negative for chest pain and leg swelling.  Gastrointestinal: Positive for abdominal pain. Negative for nausea, vomiting, diarrhea and blood in stool.  Genitourinary: Negative for frequency, hematuria, flank pain and difficulty urinating.  Musculoskeletal: Positive for myalgias. Negative for arthralgias and back pain.  Skin: Negative for rash.  Neurological: Negative for dizziness, speech difficulty, weakness, numbness and headaches.    Allergies  Morphine and related and Rocephin  Home Medications   Current Outpatient Rx  Name  Route  Sig  Dispense  Refill  . benzonatate (TESSALON PERLES) 100 MG capsule   Oral   Take 1 capsule (100 mg total) by mouth 3 (three) times daily as needed for cough.   20 capsule   0   . carisoprodol (SOMA) 350 MG tablet   Oral   Take 1 tablet (350 mg total) by mouth 3 (three) times daily.   20 tablet   1   .  ibuprofen (ADVIL,MOTRIN) 200 MG tablet   Oral   Take 400 mg by mouth every 6 (six) hours as needed for pain.         Marland Kitchen lisinopril (PRINIVIL,ZESTRIL) 20 MG tablet   Oral   Take 1 tablet (20 mg total) by mouth daily.   30 tablet   0   . traMADol (ULTRAM) 50 MG tablet   Oral   Take 1 tablet (50 mg total) by mouth every 6 (six) hours as needed for pain.   20 tablet   0   . traMADol (ULTRAM) 50 MG tablet   Oral   Take 1 tablet (50 mg total) by mouth every 6 (six) hours as needed.   30 tablet   0   . traMADol (ULTRAM)  50 MG tablet   Oral   Take 1 tablet (50 mg total) by mouth every 6 (six) hours as needed.   15 tablet   0    Pulse 112  Temp(Src) 98.4 F (36.9 C) (Oral)  Resp 18  Ht 5\' 3"  (1.6 m)  Wt 215 lb (97.523 kg)  BMI 38.09 kg/m2  SpO2 100% Physical Exam  Constitutional: She is oriented to person, place, and time. She appears well-developed and well-nourished.  HENT:  Head: Normocephalic and atraumatic.  Right Ear: External ear normal.  Left Ear: External ear normal.  Mouth/Throat: Oropharynx is clear and moist.  Eyes: Pupils are equal, round, and reactive to light.  Neck: Normal range of motion. Neck supple.  Cardiovascular: Normal rate, regular rhythm and normal heart sounds.   Pulmonary/Chest: Effort normal and breath sounds normal. No respiratory distress. She has no wheezes. She has no rales. She exhibits no tenderness.  Abdominal: Soft. Bowel sounds are normal. There is no tenderness. There is no rebound and no guarding.  Musculoskeletal: Normal range of motion. She exhibits no edema.  Lymphadenopathy:    She has no cervical adenopathy.  Neurological: She is alert and oriented to person, place, and time.  Skin: Skin is warm and dry. No rash noted.  Psychiatric: She has a normal mood and affect.    ED Course  Procedures (including critical care time) Labs Review Labs Reviewed - No data to display Imaging Review Dg Chest 2 View  03/02/2013   CLINICAL DATA:  One-week history of cough and congestion.  EXAM: CHEST  2 VIEW  COMPARISON:  March 02, 2013 at 6:04 a.m.  FINDINGS: The lungs are adequately inflated. There is no focal infiltrate nor evidence of atelectasis. The cardiopericardial silhouette is normal in size. The mediastinum is normal in width. The pulmonary vascularity is not engorged. The observed portions of the bony thorax exhibit no acute abnormalities.  IMPRESSION: There is no evidence of pneumonia nor CHF or other acute cardiopulmonary abnormality.   Electronically  Signed   By: David  [place]   On: 03/02/2013 08:09    EKG Interpretation   None       MDM   1. URI (upper respiratory infection)   2. Chronic abdominal pain    Patient is well-appearing with no signs of pneumonia. She has no symptoms that would be more suggestive of pulmonary embolus. Her oxygen saturations are normal. She does have some mild tachycardia but I did note on old visits that she frequently had some mild tachycardia as well. She was given a dose of Toradol in the ED for her achiness. She is discharged with a prescription for [facility] and a refill on her Ultram. She is  advised to return here for symptoms worsen otherwise followup with the primary care provider that she's arrange followup with.    Rolan Bucco, MD 03/02/13 0830

## 2013-04-02 ENCOUNTER — Emergency Department (HOSPITAL_BASED_OUTPATIENT_CLINIC_OR_DEPARTMENT_OTHER)
Admission: EM | Admit: 2013-04-02 | Discharge: 2013-04-02 | Discharge status: Against Medical Advice | Payer: Self-pay | Attending: Emergency Medicine | Admitting: Emergency Medicine

## 2013-04-02 ENCOUNTER — Encounter (HOSPITAL_BASED_OUTPATIENT_CLINIC_OR_DEPARTMENT_OTHER): Payer: Self-pay | Admitting: Emergency Medicine

## 2013-04-02 DIAGNOSIS — M2550 Pain in unspecified joint: Secondary | ICD-10-CM

## 2013-04-02 DIAGNOSIS — M129 Arthropathy, unspecified: Secondary | ICD-10-CM | POA: Insufficient documentation

## 2013-04-02 DIAGNOSIS — Z87891 Personal history of nicotine dependence: Secondary | ICD-10-CM | POA: Insufficient documentation

## 2013-04-02 DIAGNOSIS — M25569 Pain in unspecified knee: Secondary | ICD-10-CM | POA: Insufficient documentation

## 2013-04-02 DIAGNOSIS — Z79899 Other long term (current) drug therapy: Secondary | ICD-10-CM | POA: Insufficient documentation

## 2013-04-02 DIAGNOSIS — Z8719 Personal history of other diseases of the digestive system: Secondary | ICD-10-CM | POA: Insufficient documentation

## 2013-04-02 DIAGNOSIS — G8929 Other chronic pain: Secondary | ICD-10-CM

## 2013-04-02 DIAGNOSIS — IMO0001 Reserved for inherently not codable concepts without codable children: Secondary | ICD-10-CM | POA: Insufficient documentation

## 2013-04-02 DIAGNOSIS — Z8742 Personal history of other diseases of the female genital tract: Secondary | ICD-10-CM | POA: Insufficient documentation

## 2013-04-02 NOTE — ED Notes (Signed)
Dr. Betsey Holiday at bedside speaking with pt. Pt states she is leaving because "this is not a free clinic and I cannot afford to be seen here...".  Pt refuses to sign ama, states "I'm leaving" and walks out of ER.

## 2013-04-02 NOTE — ED Notes (Signed)
Reports bilateral knee pain-chronically, states that she is out of pain medication.

## 2013-04-02 NOTE — ED Provider Notes (Signed)
CSN: [id number]     Arrival date & time 04/02/13  1603 History   First MD Initiated Contact with Patient 04/02/13 1611     Chief Complaint  Patient presents with  . Knee Pain   (Consider location/radiation/quality/duration/timing/severity/associated sxs/prior Treatment) HPI Comments: Patient presents to the ER for evaluation of bilateral knee pain. Patient reports that she has been taking tramadol for this. She does not have a doctor, so she can for her to be seen by her doctor. She has been coming here to get refills of her tramadol. She said she was told she needs surgery.  Patient is a 53 y.o. female presenting with knee pain.  Knee Pain   Past Medical History  Diagnosis Date  . Endometriosis   . Hiatal hernia   . Fibromyalgia   . Ovarian cyst   . Arthritis   . Hypertension   . Fibroid tumor    Past Surgical History  Procedure Laterality Date  . Tonsillectomy    . Appendectomy    . Laparoscopy     Family History  Problem Relation Age of Onset  . Kidney disease Mother   . Hypertension Mother    History  Substance Use Topics  . Smoking status: Former Research scientist (life sciences)  . Smokeless tobacco: Never Used  . Alcohol Use: No   OB History   Grav Para Term Preterm Abortions TAB SAB Ect Mult Living   0 0 0 0 0 0 0 0 0 0      Review of Systems  Musculoskeletal: Positive for arthralgias.  All other systems reviewed and are negative.    Allergies  Morphine and related and Rocephin  Home Medications   Current Outpatient Rx  Name  Route  Sig  Dispense  Refill  . benzonatate (TESSALON PERLES) 100 MG capsule   Oral   Take 1 capsule (100 mg total) by mouth 3 (three) times daily as needed for cough.   20 capsule   0   . carisoprodol (SOMA) 350 MG tablet   Oral   Take 1 tablet (350 mg total) by mouth 3 (three) times daily.   20 tablet   1   . ibuprofen (ADVIL,MOTRIN) 200 MG tablet   Oral   Take 400 mg by mouth every 6 (six) hours as needed for pain.         Marland Kitchen lisinopril  (PRINIVIL,ZESTRIL) 20 MG tablet   Oral   Take 1 tablet (20 mg total) by mouth daily.   30 tablet   0   . traMADol (ULTRAM) 50 MG tablet   Oral   Take 1 tablet (50 mg total) by mouth every 6 (six) hours as needed for pain.   20 tablet   0   . traMADol (ULTRAM) 50 MG tablet   Oral   Take 1 tablet (50 mg total) by mouth every 6 (six) hours as needed.   30 tablet   0   . traMADol (ULTRAM) 50 MG tablet   Oral   Take 1 tablet (50 mg total) by mouth every 6 (six) hours as needed.   15 tablet   0    BP 152/95  Pulse 116  Temp(Src) 98.2 F (36.8 C) (Oral)  Resp 18  SpO2 100% Physical Exam  Constitutional: She is oriented to person, place, and time. She appears well-developed and well-nourished. No distress.  HENT:  Head: Normocephalic and atraumatic.  Right Ear: Hearing normal.  Left Ear: Hearing normal.  Nose: Nose normal.  Mouth/Throat: Oropharynx is  clear and moist and mucous membranes are normal.  Eyes: Conjunctivae and EOM are normal. Pupils are equal, round, and reactive to light.  Neck: Normal range of motion. Neck supple.  Cardiovascular: Regular rhythm, S1 normal and S2 normal.  Exam reveals no gallop and no friction rub.   No murmur heard. Pulmonary/Chest: Effort normal and breath sounds normal. No respiratory distress. She exhibits no tenderness.  Abdominal: Soft. Normal appearance and bowel sounds are normal. There is no hepatosplenomegaly. There is no tenderness. There is no rebound, no guarding, no tenderness at McBurney's point and negative Murphy's sign. No hernia.  Musculoskeletal: Normal range of motion.       Right knee: She exhibits no swelling, no effusion, no ecchymosis, no deformity and no erythema. Tenderness found.       Left knee: She exhibits no swelling, no effusion, no ecchymosis, no deformity and no erythema. Tenderness found.  Neurological: She is alert and oriented to person, place, and time. She has normal strength. No cranial nerve deficit or  sensory deficit. Coordination normal. GCS eye subscore is 4. GCS verbal subscore is 5. GCS motor subscore is 6.  Skin: Skin is warm, dry and intact. No rash noted. No cyanosis.  Psychiatric: She has a normal mood and affect. Her speech is normal and behavior is normal. Thought content normal.    ED Course  Procedures (including critical care time) Labs Review Labs Reviewed - No data to display Imaging Review No results found.  EKG Interpretation   None       MDM  Diagnosis: Arthritis  She presents to the ER for evaluation of pain in both her knees. Examination reveals tenderness diffusely without any overlying skin changes, no erythema, no warmth. No concern for septic joint. Patient reports that this is a chronic problem. She reports that she just had her tramadol and requires a refill. Reviewing her records reveals that she has been here multiple times in recent past and weeks' time. I explained her that she has a chronic pain problem that is not going to change, and that she come to the emergency department every 1-2 weeks and get a prescription for tramadol. He became very upset with this, but I still will not refill her tramadol, I offered to prescribe alternative, noncontrolled medications, but she walked out of the emergency department prior to discharge.  Orpah Greek, MD 04/02/13 [phone]

## 2013-05-03 ENCOUNTER — Emergency Department (HOSPITAL_BASED_OUTPATIENT_CLINIC_OR_DEPARTMENT_OTHER)
Admission: EM | Admit: 2013-05-03 | Discharge: 2013-05-03 | Disposition: A | Discharge status: Home / Self Care | Payer: Self-pay | Attending: Emergency Medicine | Admitting: Emergency Medicine

## 2013-05-03 ENCOUNTER — Encounter (HOSPITAL_BASED_OUTPATIENT_CLINIC_OR_DEPARTMENT_OTHER): Payer: Self-pay | Admitting: Emergency Medicine

## 2013-05-03 DIAGNOSIS — R109 Unspecified abdominal pain: Secondary | ICD-10-CM | POA: Insufficient documentation

## 2013-05-03 DIAGNOSIS — Z3202 Encounter for pregnancy test, result negative: Secondary | ICD-10-CM | POA: Insufficient documentation

## 2013-05-03 DIAGNOSIS — G8929 Other chronic pain: Secondary | ICD-10-CM | POA: Insufficient documentation

## 2013-05-03 DIAGNOSIS — Z87891 Personal history of nicotine dependence: Secondary | ICD-10-CM | POA: Insufficient documentation

## 2013-05-03 DIAGNOSIS — Z9089 Acquired absence of other organs: Secondary | ICD-10-CM | POA: Insufficient documentation

## 2013-05-03 DIAGNOSIS — Z8742 Personal history of other diseases of the female genital tract: Secondary | ICD-10-CM | POA: Insufficient documentation

## 2013-05-03 DIAGNOSIS — M129 Arthropathy, unspecified: Secondary | ICD-10-CM | POA: Insufficient documentation

## 2013-05-03 DIAGNOSIS — I10 Essential (primary) hypertension: Secondary | ICD-10-CM | POA: Insufficient documentation

## 2013-05-03 DIAGNOSIS — Z8719 Personal history of other diseases of the digestive system: Secondary | ICD-10-CM | POA: Insufficient documentation

## 2013-05-03 DIAGNOSIS — Z79899 Other long term (current) drug therapy: Secondary | ICD-10-CM | POA: Insufficient documentation

## 2013-05-03 LAB — URINALYSIS, ROUTINE W REFLEX MICROSCOPIC
Bilirubin Urine: NEGATIVE
Glucose, UA: NEGATIVE mg/dL
HGB URINE DIPSTICK: NEGATIVE
Ketones, ur: NEGATIVE mg/dL
Leukocytes, UA: NEGATIVE
Nitrite: NEGATIVE
Protein, ur: NEGATIVE mg/dL
SPECIFIC GRAVITY, URINE: 1.024 (ref 1.005–1.030)
Urobilinogen, UA: 0.2 mg/dL (ref 0.0–1.0)
pH: 6 (ref 5.0–8.0)

## 2013-05-03 LAB — PREGNANCY, URINE: Preg Test, Ur: NEGATIVE

## 2013-05-03 MED ORDER — TRAMADOL HCL 50 MG PO TABS: 50.0000 mg | ORAL_TABLET | Freq: Four times a day (QID) | ORAL | Status: DC | PRN

## 2013-05-03 NOTE — ED Notes (Signed)
Pt states lower abdominal pain states has known fibroids also having bleeding for a week

## 2013-05-03 NOTE — ED Notes (Signed)
Pt now states she is not having any vaginal bleeding right now describes as having a light flow that stops as soon as it starts.

## 2013-05-03 NOTE — ED Provider Notes (Signed)
CSN: [id number]     Arrival date & time 05/03/13  1206 History   First MD Initiated Contact with Patient 05/03/13 1213     Chief Complaint  Patient presents with  . Abdominal Pain     (Consider location/radiation/quality/duration/timing/severity/associated sxs/prior Treatment) HPI Comments: Pt states that she has a history of fibroids and endometriosis and she is having her normal pain.pt states that she takes ultram which helps the pain and she is out of it. Pt states that she is has not have fever vomiting, diarrhea.pt states that nothing is different then normal:pain is on the left side   The history is provided by the patient. No language interpreter was used.    Past Medical History  Diagnosis Date  . Endometriosis   . Hiatal hernia   . Fibromyalgia   . Ovarian cyst   . Arthritis   . Hypertension   . Fibroid tumor    Past Surgical History  Procedure Laterality Date  . Tonsillectomy    . Appendectomy    . Laparoscopy     Family History  Problem Relation Age of Onset  . Kidney disease Mother   . Hypertension Mother    History  Substance Use Topics  . Smoking status: Former Research scientist (life sciences)  . Smokeless tobacco: Never Used  . Alcohol Use: No   OB History   Grav Para Term Preterm Abortions TAB SAB Ect Mult Living   0 0 0 0 0 0 0 0 0 0      Review of Systems  Constitutional: Negative.   Respiratory: Negative.   Cardiovascular: Negative.       Allergies  Morphine and related and Rocephin  Home Medications   Current Outpatient Rx  Name  Route  Sig  Dispense  Refill  . benzonatate (TESSALON PERLES) 100 MG capsule   Oral   Take 1 capsule (100 mg total) by mouth 3 (three) times daily as needed for cough.   20 capsule   0   . carisoprodol (SOMA) 350 MG tablet   Oral   Take 1 tablet (350 mg total) by mouth 3 (three) times daily.   20 tablet   1   . ibuprofen (ADVIL,MOTRIN) 200 MG tablet   Oral   Take 400 mg by mouth every 6 (six) hours as needed for pain.          Marland Kitchen lisinopril (PRINIVIL,ZESTRIL) 20 MG tablet   Oral   Take 1 tablet (20 mg total) by mouth daily.   30 tablet   0   . traMADol (ULTRAM) 50 MG tablet   Oral   Take 1 tablet (50 mg total) by mouth every 6 (six) hours as needed for pain.   20 tablet   0   . traMADol (ULTRAM) 50 MG tablet   Oral   Take 1 tablet (50 mg total) by mouth every 6 (six) hours as needed.   30 tablet   0   . traMADol (ULTRAM) 50 MG tablet   Oral   Take 1 tablet (50 mg total) by mouth every 6 (six) hours as needed.   15 tablet   0   . traMADol (ULTRAM) 50 MG tablet   Oral   Take 1 tablet (50 mg total) by mouth every 6 (six) hours as needed.   15 tablet   0    BP 177/104  Pulse 102  Temp(Src) 98.4 F (36.9 C) (Oral)  Resp 20  Ht 5' 3.5" (1.613 m)  Wt 215 lb (  97.523 kg)  BMI 37.48 kg/m2  SpO2 100% Physical Exam  Nursing note and vitals reviewed. Constitutional: She is oriented to person, place, and time. She appears well-developed.  Cardiovascular: Normal rate and regular rhythm.   Pulmonary/Chest: Effort normal and breath sounds normal.  Abdominal: Soft. Bowel sounds are normal. There is no tenderness.  Musculoskeletal: Normal range of motion.  Neurological: She is alert and oriented to person, place, and time.  Skin: Skin is warm and dry.  Psychiatric: She has a normal mood and affect.    ED Course  Procedures (including critical care time) Labs Review Labs Reviewed  URINALYSIS, ROUTINE W REFLEX MICROSCOPIC - Abnormal; Notable for the following:    APPearance CLOUDY (*)    All other components within normal limits  PREGNANCY, URINE   Imaging Review No results found.  EKG Interpretation   None       MDM   Final diagnoses:  Chronic abdominal pain    Pt is not pregnant:will treat with her ultram:pt has appointment with clinic in the next week    Glendell Docker, NP 05/03/13 1323

## 2013-05-03 NOTE — Discharge Instructions (Signed)
Abdominal Pain, Adult °Many things can cause abdominal pain. Usually, abdominal pain is not caused by a disease and will improve without treatment. It can often be observed and treated at home. Your health care provider will do a physical exam and possibly order blood tests and X-rays to help determine the seriousness of your pain. However, in many cases, more time must pass before a clear cause of the pain can be found. Before that point, your health care provider may not know if you need more testing or further treatment. °HOME CARE INSTRUCTIONS  °Monitor your abdominal pain for any changes. The following actions may help to alleviate any discomfort you are experiencing: °· Only take over-the-counter or prescription medicines as directed by your health care provider. °· Do not take laxatives unless directed to do so by your health care provider. °· Try a clear liquid diet (broth, tea, or water) as directed by your health care provider. Slowly move to a bland diet as tolerated. °SEEK MEDICAL CARE IF: °· You have unexplained abdominal pain. °· You have abdominal pain associated with nausea or diarrhea. °· You have pain when you urinate or have a bowel movement. °· You experience abdominal pain that wakes you in the night. °· You have abdominal pain that is worsened or improved by eating food. °· You have abdominal pain that is worsened with eating fatty foods. °SEEK IMMEDIATE MEDICAL CARE IF:  °· Your pain does not go away within 2 hours. °· You have a fever. °· You keep throwing up (vomiting). °· Your pain is felt only in portions of the abdomen, such as the right side or the left lower portion of the abdomen. °· You pass bloody or black tarry stools. °MAKE SURE YOU: °· Understand these instructions.   °· Will watch your condition.   °· Will get help right away if you are not doing well or get worse.   °Document Released: 12/05/2004 Document Revised: 12/16/2012 Document Reviewed: 11/04/2012 °Exit[employer]® Patient  Information ©2014 [employer]. ° °

## 2013-05-10 NOTE — ED Provider Notes (Signed)
Medical screening examination/treatment/procedure(s) were performed by non-physician practitioner and as supervising physician I was immediately available for consultation/collaboration.   EKG Interpretation None        Delice Bison Nedim Oki, DO 05/10/13 1733

## 2013-05-30 ENCOUNTER — Emergency Department (HOSPITAL_BASED_OUTPATIENT_CLINIC_OR_DEPARTMENT_OTHER)
Admission: EM | Admit: 2013-05-30 | Discharge: 2013-05-30 | Disposition: A | Discharge status: Home / Self Care | Payer: Self-pay | Attending: Emergency Medicine | Admitting: Emergency Medicine

## 2013-05-30 ENCOUNTER — Encounter (HOSPITAL_BASED_OUTPATIENT_CLINIC_OR_DEPARTMENT_OTHER): Payer: Self-pay | Admitting: Emergency Medicine

## 2013-05-30 DIAGNOSIS — N949 Unspecified condition associated with female genital organs and menstrual cycle: Secondary | ICD-10-CM | POA: Insufficient documentation

## 2013-05-30 DIAGNOSIS — R102 Pelvic and perineal pain: Secondary | ICD-10-CM

## 2013-05-30 DIAGNOSIS — Z87891 Personal history of nicotine dependence: Secondary | ICD-10-CM | POA: Insufficient documentation

## 2013-05-30 DIAGNOSIS — M129 Arthropathy, unspecified: Secondary | ICD-10-CM | POA: Insufficient documentation

## 2013-05-30 DIAGNOSIS — IMO0001 Reserved for inherently not codable concepts without codable children: Secondary | ICD-10-CM | POA: Insufficient documentation

## 2013-05-30 DIAGNOSIS — Z79899 Other long term (current) drug therapy: Secondary | ICD-10-CM | POA: Insufficient documentation

## 2013-05-30 DIAGNOSIS — I10 Essential (primary) hypertension: Secondary | ICD-10-CM | POA: Insufficient documentation

## 2013-05-30 DIAGNOSIS — G8929 Other chronic pain: Secondary | ICD-10-CM | POA: Insufficient documentation

## 2013-05-30 LAB — URINALYSIS, ROUTINE W REFLEX MICROSCOPIC
Bilirubin Urine: NEGATIVE
GLUCOSE, UA: NEGATIVE mg/dL
Ketones, ur: NEGATIVE mg/dL
Leukocytes, UA: NEGATIVE
Nitrite: NEGATIVE
PH: 5.5 (ref 5.0–8.0)
Protein, ur: NEGATIVE mg/dL
SPECIFIC GRAVITY, URINE: 1.016 (ref 1.005–1.030)
Urobilinogen, UA: 0.2 mg/dL (ref 0.0–1.0)

## 2013-05-30 LAB — URINE MICROSCOPIC-ADD ON

## 2013-05-30 MED ORDER — TRAMADOL HCL 50 MG PO TABS: 50.0000 mg | ORAL_TABLET | Freq: Four times a day (QID) | ORAL | Status: DC | PRN
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
## 2013-05-30 NOTE — ED Provider Notes (Signed)
CSN: [id number]     Arrival date & time 05/30/13  1346 History   First MD Initiated Contact with Patient 05/30/13 1511     Chief Complaint  Patient presents with  . Abdominal Pain     (Consider location/radiation/quality/duration/timing/severity/associated sxs/prior Treatment) Patient is a 53 y.o. female presenting with abdominal pain. The history is provided by the patient. No language interpreter was used.  Abdominal Pain Pain location:  Generalized Pain quality: fullness   Pain radiates to:  Does not radiate Pain severity:  No pain Onset quality:  Gradual Timing:  Constant Progression:  Worsening Chronicity:  New Relieved by:  Nothing Worsened by:  Nothing tried Ineffective treatments:  None tried Associated symptoms: no vomiting   Risk factors: not pregnant   Pt has had multiple visits to ED for endometrosis and fibroid pain.   Pt seen at [facility] in October.   Pt has no followed up.  She reports losing her insurance and is waiting for medicaid.    Past Medical History  Diagnosis Date  . Endometriosis   . Hiatal hernia   . Fibromyalgia   . Ovarian cyst   . Arthritis   . Hypertension   . Fibroid tumor    Past Surgical History  Procedure Laterality Date  . Tonsillectomy    . Appendectomy    . Laparoscopy     Family History  Problem Relation Age of Onset  . Kidney disease Mother   . Hypertension Mother    History  Substance Use Topics  . Smoking status: Former Research scientist (life sciences)  . Smokeless tobacco: Never Used  . Alcohol Use: No   OB History   Grav Para Term Preterm Abortions TAB SAB Ect Mult Living   0 0 0 0 0 0 0 0 0 0      Review of Systems  Gastrointestinal: Positive for abdominal pain. Negative for vomiting.  All other systems reviewed and are negative.      Allergies  Morphine and related and Rocephin  Home Medications   Current Outpatient Rx  Name  Route  Sig  Dispense  Refill  . acetaminophen (TYLENOL) 500 MG tablet   Oral   Take 1,000 mg by  mouth every 4 (four) hours as needed.         . benzonatate (TESSALON PERLES) 100 MG capsule   Oral   Take 1 capsule (100 mg total) by mouth 3 (three) times daily as needed for cough.   20 capsule   0   . carisoprodol (SOMA) 350 MG tablet   Oral   Take 1 tablet (350 mg total) by mouth 3 (three) times daily.   20 tablet   1   . ibuprofen (ADVIL,MOTRIN) 200 MG tablet   Oral   Take 400 mg by mouth every 6 (six) hours as needed for pain.         Marland Kitchen lisinopril (PRINIVIL,ZESTRIL) 20 MG tablet   Oral   Take 1 tablet (20 mg total) by mouth daily.   30 tablet   0   . traMADol (ULTRAM) 50 MG tablet   Oral   Take 1 tablet (50 mg total) by mouth every 6 (six) hours as needed for pain.   20 tablet   0   . traMADol (ULTRAM) 50 MG tablet   Oral   Take 1 tablet (50 mg total) by mouth every 6 (six) hours as needed.   30 tablet   0   . traMADol (ULTRAM) 50 MG tablet  Oral   Take 1 tablet (50 mg total) by mouth every 6 (six) hours as needed.   15 tablet   0   . traMADol (ULTRAM) 50 MG tablet   Oral   Take 1 tablet (50 mg total) by mouth every 6 (six) hours as needed.   15 tablet   0    BP 177/114  Pulse 115  Temp(Src) 98.9 F (37.2 C) (Oral)  Resp 18  Ht 5\' 3"  (1.6 m)  Wt 212 lb (96.163 kg)  BMI 37.56 kg/m2  SpO2 100% Physical Exam  Nursing note and vitals reviewed. Constitutional: She is oriented to person, place, and time. She appears well-developed and well-nourished.  HENT:  Head: Normocephalic and atraumatic.  Right Ear: External ear normal.  Left Ear: External ear normal.  Eyes: EOM are normal. Pupils are equal, round, and reactive to light.  Neck: Normal range of motion.  Cardiovascular: Normal rate, normal heart sounds and intact distal pulses.   Pulmonary/Chest: Effort normal and breath sounds normal.  Abdominal: Soft. She exhibits no distension.  Musculoskeletal: Normal range of motion.  Neurological: She is alert and oriented to person, place, and  time.  Skin: Skin is warm.  Psychiatric: She has a normal mood and affect.    ED Course  Procedures (including critical care time) Labs Review Labs Reviewed  URINALYSIS, ROUTINE W REFLEX MICROSCOPIC - Abnormal; Notable for the following:    Hgb urine dipstick TRACE (*)    All other components within normal limits  URINE MICROSCOPIC-ADD ON - Abnormal; Notable for the following:    Bacteria, UA FEW (*)    All other components within normal limits   Imaging Review No results found.   EKG Interpretation None      MDM Pt advised to follow up at [facility] for recheck.  Pt is requesting tramadol for pain   Final diagnoses:  Chronic pelvic pain in female        Fransico Meadow, [place] 05/30/13 1622

## 2013-05-30 NOTE — Discharge Instructions (Signed)
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
## 2013-05-31 NOTE — ED Provider Notes (Signed)
Medical screening examination/treatment/procedure(s) were performed by non-physician practitioner and as supervising physician I was immediately available for consultation/collaboration.    Neta Ehlers, MD 05/31/13 [phone]

## 2013-07-04 ENCOUNTER — Emergency Department (HOSPITAL_BASED_OUTPATIENT_CLINIC_OR_DEPARTMENT_OTHER)
Admission: EM | Admit: 2013-07-04 | Discharge: 2013-07-04 | Disposition: A | Discharge status: Home / Self Care | Payer: Self-pay | Attending: Emergency Medicine | Admitting: Emergency Medicine

## 2013-07-04 ENCOUNTER — Encounter (HOSPITAL_BASED_OUTPATIENT_CLINIC_OR_DEPARTMENT_OTHER): Payer: Self-pay | Admitting: Emergency Medicine

## 2013-07-04 DIAGNOSIS — F419 Anxiety disorder, unspecified: Secondary | ICD-10-CM

## 2013-07-04 DIAGNOSIS — G8929 Other chronic pain: Secondary | ICD-10-CM | POA: Insufficient documentation

## 2013-07-04 DIAGNOSIS — Z8739 Personal history of other diseases of the musculoskeletal system and connective tissue: Secondary | ICD-10-CM | POA: Insufficient documentation

## 2013-07-04 DIAGNOSIS — I10 Essential (primary) hypertension: Secondary | ICD-10-CM | POA: Insufficient documentation

## 2013-07-04 DIAGNOSIS — F411 Generalized anxiety disorder: Secondary | ICD-10-CM | POA: Insufficient documentation

## 2013-07-04 DIAGNOSIS — Z8742 Personal history of other diseases of the female genital tract: Secondary | ICD-10-CM | POA: Insufficient documentation

## 2013-07-04 DIAGNOSIS — R079 Chest pain, unspecified: Secondary | ICD-10-CM | POA: Insufficient documentation

## 2013-07-04 DIAGNOSIS — M549 Dorsalgia, unspecified: Secondary | ICD-10-CM | POA: Insufficient documentation

## 2013-07-04 DIAGNOSIS — Z79899 Other long term (current) drug therapy: Secondary | ICD-10-CM | POA: Insufficient documentation

## 2013-07-04 DIAGNOSIS — R109 Unspecified abdominal pain: Secondary | ICD-10-CM | POA: Insufficient documentation

## 2013-07-04 DIAGNOSIS — Z87891 Personal history of nicotine dependence: Secondary | ICD-10-CM | POA: Insufficient documentation

## 2013-07-04 DIAGNOSIS — Z8719 Personal history of other diseases of the digestive system: Secondary | ICD-10-CM | POA: Insufficient documentation

## 2013-07-04 LAB — CBC WITH DIFFERENTIAL/PLATELET
BASOS ABS: 0 10*3/uL (ref 0.0–0.1)
BASOS PCT: 0 % (ref 0–1)
EOS PCT: 1 % (ref 0–5)
Eosinophils Absolute: 0.1 10*3/uL (ref 0.0–0.7)
HCT: 42.7 % (ref 36.0–46.0)
Hemoglobin: 14.9 g/dL (ref 12.0–15.0)
LYMPHS PCT: 29 % (ref 12–46)
Lymphs Abs: 2.1 10*3/uL (ref 0.7–4.0)
MCH: 30.5 pg (ref 26.0–34.0)
MCHC: 34.9 g/dL (ref 30.0–36.0)
MCV: 87.3 fL (ref 78.0–100.0)
Monocytes Absolute: 0.5 10*3/uL (ref 0.1–1.0)
Monocytes Relative: 8 % (ref 3–12)
Neutro Abs: 4.5 10*3/uL (ref 1.7–7.7)
Neutrophils Relative %: 63 % (ref 43–77)
PLATELETS: 409 10*3/uL — AB (ref 150–400)
RBC: 4.89 MIL/uL (ref 3.87–5.11)
RDW: 14 % (ref 11.5–15.5)
WBC: 7.2 10*3/uL (ref 4.0–10.5)

## 2013-07-04 LAB — BASIC METABOLIC PANEL
BUN: 9 mg/dL (ref 6–23)
CALCIUM: 10.5 mg/dL (ref 8.4–10.5)
CO2: 22 mEq/L (ref 19–32)
Chloride: 102 mEq/L (ref 96–112)
Creatinine, Ser: 0.6 mg/dL (ref 0.50–1.10)
GFR calc Af Amer: >90 mL/min (ref >90)
GFR calc non Af Amer: >90 mL/min (ref >90)
Glucose, Bld: 95 mg/dL (ref 70–99)
Potassium: 4.1 mEq/L (ref 3.7–5.3)
Sodium: 141 mEq/L (ref 137–147)

## 2013-07-04 LAB — TROPONIN I

## 2013-07-04 MED ORDER — LORAZEPAM 1 MG PO TABS: 1.0000 mg | ORAL_TABLET | Freq: Three times a day (TID) | ORAL | Status: DC | PRN

## 2013-07-04 MED ORDER — LISINOPRIL 10 MG PO TABS
10.0000 mg | ORAL_TABLET | Freq: Once | ORAL | Status: AC
  Administered 2013-07-04: 10 mg via ORAL
  Filled 2013-07-04: qty 1

## 2013-07-04 MED ORDER — TRAMADOL HCL 50 MG PO TABS: 50.0000 mg | ORAL_TABLET | Freq: Four times a day (QID) | ORAL | Status: DC | PRN

## 2013-07-04 MED ORDER — TRAMADOL HCL 50 MG PO TABS
50.0000 mg | ORAL_TABLET | Freq: Once | ORAL | Status: AC
  Administered 2013-07-04: 50 mg via ORAL
  Filled 2013-07-04: qty 1

## 2013-07-04 MED ORDER — LORAZEPAM 1 MG PO TABS
1.0000 mg | ORAL_TABLET | Freq: Once | ORAL | Status: AC
  Administered 2013-07-04: 1 mg via ORAL
  Filled 2013-07-04: qty 1

## 2013-07-04 MED ORDER — LISINOPRIL 20 MG PO TABS: 20.0000 mg | ORAL_TABLET | Freq: Every day | ORAL | Status: AC
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
## 2013-07-04 NOTE — Discharge Instructions (Signed)
Chest Pain (Nonspecific) °It is often hard to give a specific diagnosis for the cause of chest pain. There is always a chance that your pain could be related to something serious, such as a heart attack or a blood clot in the lungs. You need to follow up with your caregiver for further evaluation. °CAUSES  °· Heartburn. °· Pneumonia or bronchitis. °· Anxiety or stress. °· Inflammation around your heart (pericarditis) or lung (pleuritis or pleurisy). °· A blood clot in the lung. °· A collapsed lung (pneumothorax). It can develop suddenly on its own (spontaneous pneumothorax) or from injury (trauma) to the chest. °· Shingles infection (herpes zoster virus). °The chest wall is composed of bones, muscles, and cartilage. Any of these can be the source of the pain. °· The bones can be bruised by injury. °· The muscles or cartilage can be strained by coughing or overwork. °· The cartilage can be affected by inflammation and become sore (costochondritis). °DIAGNOSIS  °Lab tests or other studies, such as X-rays, electrocardiography, stress testing, or cardiac imaging, may be needed to find the cause of your pain.  °TREATMENT  °· Treatment depends on what may be causing your chest pain. Treatment may include: °· Acid blockers for heartburn. °· Anti-inflammatory medicine. °· Pain medicine for inflammatory conditions. °· Antibiotics if an infection is present. °· You may be advised to change lifestyle habits. This includes stopping smoking and avoiding alcohol, caffeine, and chocolate. °· You may be advised to keep your head raised (elevated) when sleeping. This reduces the chance of acid going backward from your stomach into your esophagus. °· Most of the time, nonspecific chest pain will improve within 2 to 3 days with rest and mild pain medicine. °HOME CARE INSTRUCTIONS  °· If antibiotics were prescribed, take your antibiotics as directed. Finish them even if you start to feel better. °· For the next few days, avoid physical  activities that bring on chest pain. Continue physical activities as directed. °· Do not smoke. °· Avoid drinking alcohol. °· Only take over-the-counter or prescription medicine for pain, discomfort, or fever as directed by your caregiver. °· Follow your caregiver's suggestions for further testing if your chest pain does not go away. °· Keep any follow-up appointments you made. If you do not go to an appointment, you could develop lasting (chronic) problems with pain. If there is any problem keeping an appointment, you must call to reschedule. °SEEK MEDICAL CARE IF:  °· You think you are having problems from the medicine you are taking. Read your medicine instructions carefully. °· Your chest pain does not go away, even after treatment. °· You develop a rash with blisters on your chest. °SEEK IMMEDIATE MEDICAL CARE IF:  °· You have increased chest pain or pain that spreads to your arm, neck, jaw, back, or abdomen. °· You develop shortness of breath, an increasing cough, or you are coughing up blood. °· You have severe back or abdominal pain, feel nauseous, or vomit. °· You develop severe weakness, fainting, or chills. °· You have a fever. °THIS IS AN EMERGENCY. Do not wait to see if the pain will go away. Get medical help at once. Call your local emergency services (911 in [place] Do not drive yourself to the hospital. °MAKE SURE YOU:  °· Understand these instructions. °· Will watch your condition. °· Will get help right away if you are not doing well or get worse. °Document Released: 12/05/2004 Document Revised: 05/20/2011 Document Reviewed: 10/01/2007 °ExitCare® Patient Information ©2014 [employer]   [employer]. Generalized Anxiety Disorder Generalized anxiety disorder (GAD) is a mental disorder. It interferes with life functions, including relationships, work, and school. GAD is different from normal anxiety, which everyone experiences at some point in their lives in response to specific life events and activities.  Normal anxiety actually helps [place] prepare for and get through these life events and activities. Normal anxiety goes away after the event or activity is over.  GAD causes anxiety that is not necessarily related to specific events or activities. It also causes excess anxiety in proportion to specific events or activities. The anxiety associated with GAD is also difficult to control. GAD can vary from mild to severe. People with severe GAD can have intense waves of anxiety with physical symptoms (panic attacks).  SYMPTOMS The anxiety and worry associated with GAD are difficult to control. This anxiety and worry are related to many life events and activities and also occur more days than not for 6 months or longer. People with GAD also have three or more of the following symptoms (one or more in children):  Restlessness.   Fatigue.  Difficulty concentrating.   Irritability.  Muscle tension.  Difficulty sleeping or unsatisfying sleep. DIAGNOSIS GAD is diagnosed through an assessment by your caregiver. Your caregiver will ask you questions aboutyour mood,physical symptoms, and events in your life. Your caregiver may ask you about your medical history and use of alcohol or drugs, including prescription medications. Your caregiver may also do a physical exam and blood tests. Certain medical conditions and the use of certain substances can cause symptoms similar to those associated with GAD. Your caregiver may refer you to a mental health specialist for further evaluation. TREATMENT The following therapies are usually used to treat GAD:   Medication Antidepressant medication usually is prescribed for long-term daily control. Antianxiety medications may be added in severe cases, especially when panic attacks occur.   Talk therapy (psychotherapy) Certain types of talk therapy can be helpful in treating GAD by providing support, education, and guidance. A form of talk therapy called cognitive  behavioral therapy can teach you healthy ways to think about and react to daily life events and activities.  Stress managementtechniques These include yoga, meditation, and exercise and can be very helpful when they are practiced regularly. A mental health specialist can help determine which treatment is best for you. Some people see improvement with one therapy. However, other people require a combination of therapies. Document Released: 06/22/2012 Document Reviewed: 06/22/2012 [facility] Patient Information 2014 [place], [place].

## 2013-07-08 ENCOUNTER — Encounter: Payer: Self-pay | Admitting: Obstetrics & Gynecology

## 2013-07-08 NOTE — ED Provider Notes (Signed)
Medical screening examination/treatment/procedure(s) were performed by non-physician practitioner and as supervising physician I was immediately available for consultation/collaboration.   EKG Interpretation   Date/Time:  Sunday July 04 2013 13:32:37 EDT Ventricular Rate:  99 PR Interval:  188 QRS Duration: 98 QT Interval:  354 QTC Calculation: 454 R Axis:   24 Text Interpretation:  Normal sinus rhythm Slow R wave progression.  Abnormal ECG No ST elevation. No comparison EKG Confirmed by Jeneen Rinks  MD,  [place] ([id number]) on 07/04/2013 1:40:56 PM        Tanna Furry, MD 07/08/13 [phone]

## 2013-08-17 ENCOUNTER — Emergency Department (HOSPITAL_BASED_OUTPATIENT_CLINIC_OR_DEPARTMENT_OTHER)
Admission: EM | Admit: 2013-08-17 | Discharge: 2013-08-17 | Disposition: A | Discharge status: Home / Self Care | Payer: Self-pay | Attending: Emergency Medicine | Admitting: Emergency Medicine

## 2013-08-17 ENCOUNTER — Encounter (HOSPITAL_BASED_OUTPATIENT_CLINIC_OR_DEPARTMENT_OTHER): Payer: Self-pay | Admitting: Emergency Medicine

## 2013-08-17 ENCOUNTER — Emergency Department (HOSPITAL_BASED_OUTPATIENT_CLINIC_OR_DEPARTMENT_OTHER): Payer: Self-pay

## 2013-08-17 DIAGNOSIS — L02419 Cutaneous abscess of limb, unspecified: Secondary | ICD-10-CM | POA: Insufficient documentation

## 2013-08-17 DIAGNOSIS — Z8719 Personal history of other diseases of the digestive system: Secondary | ICD-10-CM | POA: Insufficient documentation

## 2013-08-17 DIAGNOSIS — L03119 Cellulitis of unspecified part of limb: Secondary | ICD-10-CM

## 2013-08-17 DIAGNOSIS — Z8739 Personal history of other diseases of the musculoskeletal system and connective tissue: Secondary | ICD-10-CM | POA: Insufficient documentation

## 2013-08-17 DIAGNOSIS — Z79899 Other long term (current) drug therapy: Secondary | ICD-10-CM | POA: Insufficient documentation

## 2013-08-17 DIAGNOSIS — Z8742 Personal history of other diseases of the female genital tract: Secondary | ICD-10-CM | POA: Insufficient documentation

## 2013-08-17 DIAGNOSIS — Z792 Long term (current) use of antibiotics: Secondary | ICD-10-CM | POA: Insufficient documentation

## 2013-08-17 DIAGNOSIS — N390 Urinary tract infection, site not specified: Secondary | ICD-10-CM | POA: Insufficient documentation

## 2013-08-17 DIAGNOSIS — I10 Essential (primary) hypertension: Secondary | ICD-10-CM | POA: Insufficient documentation

## 2013-08-17 DIAGNOSIS — R109 Unspecified abdominal pain: Secondary | ICD-10-CM

## 2013-08-17 DIAGNOSIS — Z3202 Encounter for pregnancy test, result negative: Secondary | ICD-10-CM | POA: Insufficient documentation

## 2013-08-17 DIAGNOSIS — L0291 Cutaneous abscess, unspecified: Secondary | ICD-10-CM

## 2013-08-17 DIAGNOSIS — Z87891 Personal history of nicotine dependence: Secondary | ICD-10-CM | POA: Insufficient documentation

## 2013-08-17 LAB — COMPREHENSIVE METABOLIC PANEL
ALT: 13 U/L (ref 0–35)
AST: 13 U/L (ref 0–37)
Albumin: 3.9 g/dL (ref 3.5–5.2)
Alkaline Phosphatase: 102 U/L (ref 39–117)
BUN: 8 mg/dL (ref 6–23)
CALCIUM: 10 mg/dL (ref 8.4–10.5)
CO2: 23 meq/L (ref 19–32)
CREATININE: 0.7 mg/dL (ref 0.50–1.10)
Chloride: 104 mEq/L (ref 96–112)
GFR calc non Af Amer: >90 mL/min (ref >90)
Glucose, Bld: 108 mg/dL — ABNORMAL HIGH (ref 70–99)
Potassium: 4.1 mEq/L (ref 3.7–5.3)
Sodium: 140 mEq/L (ref 137–147)
Total Bilirubin: 0.3 mg/dL (ref 0.3–1.2)
Total Protein: 7.8 g/dL (ref 6.0–8.3)

## 2013-08-17 LAB — URINALYSIS, ROUTINE W REFLEX MICROSCOPIC
Bilirubin Urine: NEGATIVE
Glucose, UA: NEGATIVE mg/dL
Ketones, ur: NEGATIVE mg/dL
Nitrite: POSITIVE — AB
Protein, ur: NEGATIVE mg/dL
Specific Gravity, Urine: 1.012 (ref 1.005–1.030)
Urobilinogen, UA: 0.2 mg/dL (ref 0.0–1.0)
pH: 6 (ref 5.0–8.0)

## 2013-08-17 LAB — CBC WITH DIFFERENTIAL/PLATELET
Basophils Absolute: 0 10*3/uL (ref 0.0–0.1)
Basophils Relative: 0 % (ref 0–1)
EOS PCT: 0 % (ref 0–5)
Eosinophils Absolute: 0 10*3/uL (ref 0.0–0.7)
HEMATOCRIT: 40.2 % (ref 36.0–46.0)
Hemoglobin: 14.1 g/dL (ref 12.0–15.0)
LYMPHS ABS: 1.6 10*3/uL (ref 0.7–4.0)
LYMPHS PCT: 18 % (ref 12–46)
MCH: 30.5 pg (ref 26.0–34.0)
MCHC: 35.1 g/dL (ref 30.0–36.0)
MCV: 87 fL (ref 78.0–100.0)
MONO ABS: 0.9 10*3/uL (ref 0.1–1.0)
MONOS PCT: 10 % (ref 3–12)
Neutro Abs: 6.2 10*3/uL (ref 1.7–7.7)
Neutrophils Relative %: 71 % (ref 43–77)
Platelets: 412 10*3/uL — ABNORMAL HIGH (ref 150–400)
RBC: 4.62 MIL/uL (ref 3.87–5.11)
RDW: 13.7 % (ref 11.5–15.5)
WBC: 8.6 10*3/uL (ref 4.0–10.5)

## 2013-08-17 LAB — PREGNANCY, URINE: PREG TEST UR: NEGATIVE

## 2013-08-17 LAB — URINE MICROSCOPIC-ADD ON

## 2013-08-17 LAB — LIPASE, BLOOD: Lipase: 40 U/L (ref 11–59)

## 2013-08-17 MED ORDER — HYDROMORPHONE HCL PF 1 MG/ML IJ SOLN
0.5000 mg | Freq: Once | INTRAMUSCULAR | Status: AC
  Administered 2013-08-17: 0.5 mg via INTRAVENOUS
  Filled 2013-08-17: qty 1

## 2013-08-17 MED ORDER — TRAMADOL HCL 50 MG PO TABS: 50.0000 mg | ORAL_TABLET | Freq: Four times a day (QID) | ORAL | Status: DC | PRN

## 2013-08-17 MED ORDER — SULFAMETHOXAZOLE-TRIMETHOPRIM 800-160 MG PO TABS: 1.0000 | ORAL_TABLET | Freq: Two times a day (BID) | ORAL | Status: AC

## 2013-08-17 MED ORDER — ONDANSETRON 4 MG PO TBDP: ORAL_TABLET | ORAL | Status: AC

## 2013-08-17 MED ORDER — OMEPRAZOLE 20 MG PO CPDR: 20.0000 mg | DELAYED_RELEASE_CAPSULE | Freq: Every day | ORAL | Status: AC

## 2013-08-17 MED ORDER — SODIUM CHLORIDE 0.9 % IV BOLUS (SEPSIS)
1000.0000 mL | Freq: Once | INTRAVENOUS | Status: AC
  Administered 2013-08-17: 1000 mL via INTRAVENOUS

## 2013-08-17 MED ORDER — ONDANSETRON HCL 4 MG/2ML IJ SOLN
4.0000 mg | Freq: Once | INTRAMUSCULAR | Status: AC
  Administered 2013-08-17: 4 mg via INTRAVENOUS
  Filled 2013-08-17: qty 2

## 2013-08-17 NOTE — ED Notes (Signed)
Pt reports 2 days of N/V and RUQ pain.  Also reports bug bite on leg.

## 2013-08-17 NOTE — Discharge Instructions (Signed)
YOU NEED TO HAVE A FOLLOW UP ULTRASOUND OF YOUR KIDNEYS IN ABOUT 6 MONTHS TO ASSESS FOR CHANGE IN SIZE OF GROWTH. Abdominal Pain, Adult Many things can cause abdominal pain. Usually, abdominal pain is not caused by a disease and will improve without treatment. It can often be observed and treated at home. Your health care provider will do a physical exam and possibly order blood tests and X-rays to help determine the seriousness of your pain. However, in many cases, more time must pass before a clear cause of the pain can be found. Before that point, your health care provider may not know if you need more testing or further treatment. HOME CARE INSTRUCTIONS  Monitor your abdominal pain for any changes. The following actions may help to alleviate any discomfort you are experiencing:  Only take over-the-counter or prescription medicines as directed by your health care provider.  Do not take laxatives unless directed to do so by your health care provider.  Try a clear liquid diet (broth, tea, or water) as directed by your health care provider. Slowly move to a bland diet as tolerated. SEEK MEDICAL CARE IF:  You have unexplained abdominal pain.  You have abdominal pain associated with nausea or diarrhea.  You have pain when you urinate or have a bowel movement.  You experience abdominal pain that wakes you in the night.  You have abdominal pain that is worsened or improved by eating food.  You have abdominal pain that is worsened with eating fatty foods. SEEK IMMEDIATE MEDICAL CARE IF:   Your pain does not go away within 2 hours.  You have a fever.  You keep throwing up (vomiting).  Your pain is felt only in portions of the abdomen, such as the right side or the left lower portion of the abdomen.  You pass bloody or black tarry stools. MAKE SURE YOU:  Understand these instructions.   Will watch your condition.   Will get help right away if you are not doing well or get worse.   Document Released: 12/05/2004 Document Revised: 12/16/2012 Document Reviewed: 11/04/2012 [facility] Patient Information 2014 [place].  Urinary Tract Infection Urinary tract infections (UTIs) can develop anywhere along your urinary tract. Your urinary tract is your body's drainage system for removing wastes and extra water. Your urinary tract includes two kidneys, two ureters, a bladder, and a urethra. Your kidneys are a pair of bean-shaped organs. Each kidney is about the size of your fist. They are located below your ribs, one on each side of your spine. CAUSES Infections are caused by microbes, which are microscopic organisms, including fungi, viruses, and bacteria. These organisms are so small that they can only be seen through a microscope. Bacteria are the microbes that most commonly cause UTIs. SYMPTOMS  Symptoms of UTIs may vary by age and gender of the patient and by the location of the infection. Symptoms in young women typically include a frequent and intense urge to urinate and a painful, burning feeling in the bladder or urethra during urination. Older women and men are more likely to be tired, shaky, and weak and have muscle aches and abdominal pain. A fever may mean the infection is in your kidneys. Other symptoms of a kidney infection include pain in your back or sides below the ribs, nausea, and vomiting. DIAGNOSIS To diagnose a UTI, your caregiver will ask you about your symptoms. Your caregiver also will ask to provide a urine sample. The urine sample will be tested for  bacteria and white blood cells. White blood cells are made by your body to help fight infection. TREATMENT  Typically, UTIs can be treated with medication. Because most UTIs are caused by a bacterial infection, they usually can be treated with the use of antibiotics. The choice of antibiotic and length of treatment depend on your symptoms and the type of bacteria causing your infection. HOME CARE  INSTRUCTIONS  If you were prescribed antibiotics, take them exactly as your caregiver instructs you. Finish the medication even if you feel better after you have only taken some of the medication.  Drink enough water and fluids to keep your urine clear or pale yellow.  Avoid caffeine, tea, and carbonated beverages. They tend to irritate your bladder.  Empty your bladder often. Avoid holding urine for long periods of time.  Empty your bladder before and after sexual intercourse.  After a bowel movement, women should cleanse from front to back. Use each tissue only once. SEEK MEDICAL CARE IF:   You have back pain.  You develop a fever.  Your symptoms do not begin to resolve within 3 days. SEEK IMMEDIATE MEDICAL CARE IF:   You have severe back pain or lower abdominal pain.  You develop chills.  You have nausea or vomiting.  You have continued burning or discomfort with urination. MAKE SURE YOU:   Understand these instructions.  Will watch your condition.  Will get help right away if you are not doing well or get worse. Document Released: 12/05/2004 Document Revised: 08/27/2011 Document Reviewed: 04/05/2011 [facility] Patient Information 2014 [place].

## 2013-08-17 NOTE — ED Provider Notes (Addendum)
CSN: [id number]     Arrival date & time 6/9/Debra  1029 History   First MD Initiated Contact with Patient 06/09/Debra 1059     Chief Complaint  Patient presents with  . Abdominal Pain     (Consider location/radiation/quality/duration/timing/severity/associated sxs/prior Treatment) HPI Comments: Patient presents with abdominal pain. She has a two-day history of worsening pain to her upper abdomen. She has associated nausea and vomiting. She denies any diarrhea or constipation. She has chronic pain in her pelvic area related to fibroid tumor which is unchanged from baseline. She normally takes tramadol for this but is out of her tramadol. She denies any fevers or chills. She does have a little bit of burning on urination. She's not been taking any medications for it the pain. She also noticed 2 days ago that she had a sore on her left leg. She has some constant throbbing pain to the area.  Patient is a 53 y.o. Debra [name] presenting with abdominal pain.  Abdominal Pain Associated symptoms: dysuria, nausea and vomiting   Associated symptoms: no chest pain, no chills, no cough, no diarrhea, no fatigue, no fever, no hematuria and no shortness of breath     Past Medical History  Diagnosis Date  . Endometriosis   . Hiatal hernia   . Fibromyalgia   . Ovarian cyst   . Arthritis   . Hypertension   . Fibroid tumor    Past Surgical History  Procedure Laterality Date  . Tonsillectomy    . Appendectomy    . Laparoscopy     Family History  Problem Relation Age of Onset  . Kidney disease Mother   . Hypertension Mother    History  Substance Use Topics  . Smoking status: Former Research scientist (life sciences)  . Smokeless tobacco: Never Used  . Alcohol Use: No   OB History   Grav Para Term Preterm Abortions TAB SAB Ect Mult Living   0 0 0 0 0 0 0 0 0 0      Review of Systems  Constitutional: Negative for fever, chills, diaphoresis and fatigue.  HENT: Negative for congestion, rhinorrhea and sneezing.   Eyes: Negative.    Respiratory: Negative for cough, chest tightness and shortness of breath.   Cardiovascular: Negative for chest pain and leg swelling.  Gastrointestinal: Positive for nausea, vomiting and abdominal pain. Negative for diarrhea and blood in stool.  Genitourinary: Positive for dysuria and pelvic pain. Negative for frequency, hematuria, flank pain and difficulty urinating.  Musculoskeletal: Negative for arthralgias and back pain.  Skin: Positive for wound. Negative for rash.  Neurological: Negative for dizziness, speech difficulty, weakness, numbness and headaches.      Allergies  Morphine and related and Rocephin  Home Medications   Prior to Admission medications   Medication Sig Start Date End Date Taking? Authorizing Provider  lisinopril (PRINIVIL,ZESTRIL) 20 MG tablet Take 1 tablet (20 mg total) by mouth daily. 4/26/Debra   Shari A Upstill, PA-C  ondansetron (ZOFRAN ODT) 4 MG disintegrating tablet 4mg  ODT q4 hours prn nausea/vomit 6/9/Debra   Malvin Johns, MD  sulfamethoxazole-trimethoprim (BACTRIM DS,SEPTRA DS) 800-160 MG per tablet Take 1 tablet by mouth 2 (two) times daily. 6/9/Debra 6/16/Debra  Malvin Johns, MD  traMADol (ULTRAM) 50 MG tablet Take 1 tablet (50 mg total) by mouth every 6 (six) hours as needed. 4/26/Debra   Shari A Upstill, PA-C  traMADol (ULTRAM) 50 MG tablet Take 1 tablet (50 mg total) by mouth every 6 (six) hours as needed. 6/9/Debra   Malvin Johns, MD  BP 145/88  Pulse 118  Temp(Src) 98.6 F (37 C) (Oral)  Resp 18  SpO2 98% Physical Exam  Constitutional: She is oriented to person, place, and time. She appears well-developed and well-nourished.  HENT:  Head: Normocephalic and atraumatic.  Eyes: Pupils are equal, round, and reactive to light.  Neck: Normal range of motion. Neck supple.  Cardiovascular: Normal rate, regular rhythm and normal heart sounds.   Pulmonary/Chest: Effort normal and breath sounds normal. No respiratory distress. She has no wheezes. She has no  rales. She exhibits no tenderness.  Abdominal: Soft. Bowel sounds are normal. There is tenderness (Moderate tenderness to right upper quadrant.). There is no rebound and no guarding.  Musculoskeletal: Normal range of motion. She exhibits no edema.  Lymphadenopathy:    She has no cervical adenopathy.  Neurological: She is alert and oriented to person, place, and time.  Skin: Skin is warm and dry. No rash noted.  1 cm area of erythema to her left calf. There is no drainage. No induration or fluctuance. There is a small scab at the center of the wound.  Psychiatric: She has a normal mood and affect.    ED Course  Procedures (including critical care time) Labs Review Results for orders placed during the hospital encounter of 06/09/Debra  URINALYSIS, ROUTINE W REFLEX MICROSCOPIC      Result Value Ref Range   Color, Urine YELLOW  YELLOW   APPearance CLOUDY (*) CLEAR   Specific Gravity, Urine 1.012  1.005 - 1.030   pH 6.0  5.0 - 8.0   Glucose, UA NEGATIVE  NEGATIVE mg/dL   Hgb urine dipstick TRACE (*) NEGATIVE   Bilirubin Urine NEGATIVE  NEGATIVE   Ketones, ur NEGATIVE  NEGATIVE mg/dL   Protein, ur NEGATIVE  NEGATIVE mg/dL   Urobilinogen, UA 0.2  0.0 - 1.0 mg/dL   Nitrite POSITIVE (*) NEGATIVE   Leukocytes, UA TRACE (*) NEGATIVE  CBC WITH DIFFERENTIAL      Result Value Ref Range   WBC 8.6  4.0 - 10.5 K/uL   RBC 4.62  3.87 - 5.11 MIL/uL   Hemoglobin 14.1  12.0 - Debra.0 g/dL   HCT 40.2  36.0 - 46.0 %   MCV 87.0  78.0 - 100.0 fL   MCH 30.5  26.0 - 34.0 pg   MCHC 35.1  30.0 - 36.0 g/dL   RDW 13.7  11.5 - Debra.5 %   Platelets 412 (*) 150 - 400 K/uL   Neutrophils Relative % 71  43 - 77 %   Neutro Abs 6.2  1.7 - 7.7 K/uL   Lymphocytes Relative 18  12 - 46 %   Lymphs Abs 1.6  0.7 - 4.0 K/uL   Monocytes Relative 10  3 - 12 %   Monocytes Absolute 0.9  0.1 - 1.0 K/uL   Eosinophils Relative 0  0 - 5 %   Eosinophils Absolute 0.0  0.0 - 0.7 K/uL   Basophils Relative 0  0 - 1 %   Basophils  Absolute 0.0  0.0 - 0.1 K/uL  COMPREHENSIVE METABOLIC PANEL      Result Value Ref Range   Sodium 140  137 - 147 mEq/L   Potassium 4.1  3.7 - 5.3 mEq/L   Chloride 104  96 - 112 mEq/L   CO2 23  19 - 32 mEq/L   Glucose, Bld 108 (*) 70 - 99 mg/dL   BUN 8  6 - 23 mg/dL   Creatinine, Ser 0.70  0.50 -  1.10 mg/dL   Calcium 10.0  8.4 - 10.5 mg/dL   Total Protein 7.8  6.0 - 8.3 g/dL   Albumin 3.9  3.5 - 5.2 g/dL   AST 13  0 - 37 U/L   ALT 13  0 - 35 U/L   Alkaline Phosphatase 102  39 - 117 U/L   Total Bilirubin 0.3  0.3 - 1.2 mg/dL   GFR calc non Af Amer >90  >90 mL/min   GFR calc Af Amer >90  >90 mL/min  LIPASE, BLOOD      Result Value Ref Range   Lipase 40  11 - 59 U/L  PREGNANCY, URINE      Result Value Ref Range   Preg Test, Ur NEGATIVE  NEGATIVE  URINE MICROSCOPIC-ADD ON      Result Value Ref Range   Squamous Epithelial / LPF RARE  RARE   WBC, UA 7-10  <3 WBC/hpf   RBC / HPF 3-6  <3 RBC/hpf   Bacteria, UA MANY (*) RARE   Urine-Other MUCOUS PRESENT     US Abdomen Complete  08/17/2013   CLINICAL DATA:  Right upper quadrant pain and nausea for 2 days. Hiatal hernia.  EXAM: ULTRASOUND ABDOMEN COMPLETE  COMPARISON:  Lead/ 25/2014 CT.  FINDINGS: Gallbladder:  No gallstones or wall thickening visualized. No sonographic Murphy sign noted.  Common bile duct:  Diameter: 3 mm, normal.  Liver:  No focal lesion identified. Within normal limits in parenchymal echogenicity. Liver is isoechoic when compared to the adjacent right kidney.  IVC:  No abnormality visualized.  Pancreas:  Visualized portion unremarkable.  Spleen:  67 mm.  Normal echotexture.  Right Kidney:  Length: 12.4 cm. Echogenicity within normal limits. No mass or hydronephrosis visualized.  Left Kidney:  Length: 11.9 cm. In the left upper pole, there is a small (10 mm x 9 mm x 7 mm) hyperechoic lesion that appears intra cortical. 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 On the coronal reconstructed images from the prior CT, this appears to be fatty density although it is tiny and partial volume averaging makes measurement difficult.  Please note that this is incorrectly documented on the images as being in the right upper pole and the technologist will correct the images.  Abdominal aorta:  No aneurysm visualized.  Other findings:  None.  IMPRESSION: 1. No acute abnormality. Negative for cholelithiasis or cholecystitis. 2. Tiny left upper pole hyperechoic renal lesion. 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 Six month followup renal ultrasound recommended to assess for stability. 3. Normal appearance of the liver which is isoechoic compared to the adjacent right kidney. No sonographic evidence of hepatosteatosis.   Electronically Signed   By: Dereck Ligas M.D.   On: 08/17/2013 12:25      Imaging Review US Abdomen Complete  08/17/2013   CLINICAL DATA:  Right upper quadrant pain and nausea for 2 days. Hiatal hernia.  EXAM: ULTRASOUND ABDOMEN COMPLETE  COMPARISON:  Lead/ 25/2014 CT.  FINDINGS: Gallbladder:  No gallstones or wall thickening visualized. No sonographic Murphy sign noted.  Common bile duct:  Diameter: 3 mm, normal.  Liver:  No focal lesion identified. Within normal limits in parenchymal echogenicity. Liver is isoechoic when compared to the adjacent right kidney.  IVC:  No abnormality visualized.  Pancreas:  Visualized portion unremarkable.  Spleen:  67 mm.  Normal echotexture.  Right Kidney:  Length: 12.4 cm. Echogenicity within normal limits. No mass or hydronephrosis visualized.  Left Kidney:  Length: 11.9 cm. In the left upper pole, there is a small (10 mm x 9 mm x 7 mm) hyperechoic lesion that appears intra cortical. In conjunction with the prior CT, there was a low-density lesion on the prior examination and this is most compatible with a small renal  angiomyolipoma. On the coronal reconstructed images from the prior CT, this appears to be fatty density although it is tiny and partial volume averaging makes measurement difficult.  Please note that this is incorrectly documented on the images as being in the right upper pole and the technologist will correct the images.  Abdominal aorta:  No aneurysm visualized.  Other findings:  None.  IMPRESSION: 1. No acute abnormality. Negative for cholelithiasis or cholecystitis. 2. Tiny left upper pole hyperechoic renal lesion. 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 Six month followup renal ultrasound recommended to assess for stability. 3. Normal appearance of the liver which is isoechoic compared to the adjacent right kidney. No sonographic evidence of hepatosteatosis.   Electronically Signed   By: Dereck Ligas M.D.   On: 08/17/2013 12:25     EKG Interpretation None      MDM   Final diagnoses:  Abdominal pain  UTI (lower urinary tract infection)  Abscess    Patient presents with upper abdominal pain. Her ultrasound is negative for cholelithiasis or cholecystitis. This could just represent gastritis. She has chronic abdominal pain but doesn't usually have in this area. She has no pain in her right lower quadrant. She was given a prescription first tramadol and Zofran. She also has a urinary tract infection and a small skin early abscess. The abscesses very small and did not warrant I&D. I will go ahead and prescribe her Bactrim to cover both of these. Also start her on a proton pump inhibitor. I gave her a referral to followup with a primary care physician. Encouraged her return here for symptoms worsen. I also advised her that the small 1 cm lesion to the left kidney that will need a followup ultrasound in 6 months to assess for stability. She is noted to by tachycardic, but on review of records, has been tachycardic on all of her last ED  visits.   Malvin Johns, MD 06/09/Debra 1255  Malvin Johns, MD 06/09/Debra [place], MD 06/09/Debra 1258

## 2013-09-16 ENCOUNTER — Ambulatory Visit: Payer: Self-pay | Admitting: Physician Assistant

## 2013-09-16 DIAGNOSIS — Z0289 Encounter for other administrative examinations: Secondary | ICD-10-CM

## 2013-09-23 ENCOUNTER — Emergency Department (HOSPITAL_BASED_OUTPATIENT_CLINIC_OR_DEPARTMENT_OTHER)
Admission: EM | Admit: 2013-09-23 | Discharge: 2013-09-23 | Disposition: A | Discharge status: Home / Self Care | Payer: Self-pay | Attending: Emergency Medicine | Admitting: Emergency Medicine

## 2013-09-23 ENCOUNTER — Encounter (HOSPITAL_BASED_OUTPATIENT_CLINIC_OR_DEPARTMENT_OTHER): Payer: Self-pay | Admitting: Emergency Medicine

## 2013-09-23 DIAGNOSIS — G8929 Other chronic pain: Secondary | ICD-10-CM | POA: Insufficient documentation

## 2013-09-23 DIAGNOSIS — Z9889 Other specified postprocedural states: Secondary | ICD-10-CM | POA: Insufficient documentation

## 2013-09-23 DIAGNOSIS — I10 Essential (primary) hypertension: Secondary | ICD-10-CM | POA: Insufficient documentation

## 2013-09-23 DIAGNOSIS — Z8739 Personal history of other diseases of the musculoskeletal system and connective tissue: Secondary | ICD-10-CM | POA: Insufficient documentation

## 2013-09-23 DIAGNOSIS — N949 Unspecified condition associated with female genital organs and menstrual cycle: Secondary | ICD-10-CM | POA: Insufficient documentation

## 2013-09-23 DIAGNOSIS — Z3202 Encounter for pregnancy test, result negative: Secondary | ICD-10-CM | POA: Insufficient documentation

## 2013-09-23 DIAGNOSIS — Z79899 Other long term (current) drug therapy: Secondary | ICD-10-CM | POA: Insufficient documentation

## 2013-09-23 DIAGNOSIS — R102 Pelvic and perineal pain: Secondary | ICD-10-CM

## 2013-09-23 DIAGNOSIS — Z87891 Personal history of nicotine dependence: Secondary | ICD-10-CM | POA: Insufficient documentation

## 2013-09-23 DIAGNOSIS — Z8742 Personal history of other diseases of the female genital tract: Secondary | ICD-10-CM | POA: Insufficient documentation

## 2013-09-23 DIAGNOSIS — Z8719 Personal history of other diseases of the digestive system: Secondary | ICD-10-CM | POA: Insufficient documentation

## 2013-09-23 DIAGNOSIS — N390 Urinary tract infection, site not specified: Secondary | ICD-10-CM | POA: Insufficient documentation

## 2013-09-23 LAB — URINALYSIS, ROUTINE W REFLEX MICROSCOPIC
Bilirubin Urine: NEGATIVE
Glucose, UA: NEGATIVE mg/dL
KETONES UR: NEGATIVE mg/dL
NITRITE: POSITIVE — AB
PH: 7 (ref 5.0–8.0)
PROTEIN: NEGATIVE mg/dL
Specific Gravity, Urine: 1.013 (ref 1.005–1.030)
Urobilinogen, UA: 0.2 mg/dL (ref 0.0–1.0)

## 2013-09-23 LAB — URINE MICROSCOPIC-ADD ON

## 2013-09-23 LAB — PREGNANCY, URINE: Preg Test, Ur: NEGATIVE

## 2013-09-23 MED ORDER — PROMETHAZINE HCL 25 MG PO TABS: 25.0000 mg | ORAL_TABLET | Freq: Four times a day (QID) | ORAL | Status: DC | PRN

## 2013-09-23 MED ORDER — ONDANSETRON 4 MG PO TBDP
4.0000 mg | ORAL_TABLET | Freq: Once | ORAL | Status: AC
  Administered 2013-09-23: 4 mg via ORAL
  Filled 2013-09-23: qty 1

## 2013-09-23 MED ORDER — TRAMADOL HCL 50 MG PO TABS: 50.0000 mg | ORAL_TABLET | Freq: Four times a day (QID) | ORAL | Status: DC | PRN

## 2013-09-23 MED ORDER — SULFAMETHOXAZOLE-TRIMETHOPRIM 800-160 MG PO TABS: 1.0000 | ORAL_TABLET | Freq: Two times a day (BID) | ORAL | Status: AC

## 2013-09-23 MED ORDER — TRAMADOL HCL 50 MG PO TABS
50.0000 mg | ORAL_TABLET | Freq: Once | ORAL | Status: AC
  Administered 2013-09-23: 50 mg via ORAL
  Filled 2013-09-23: qty 1

## 2013-09-23 NOTE — ED Notes (Signed)
Abdominal pain. States she thinks she has endometriosis.

## 2013-09-23 NOTE — ED Notes (Signed)
Pt having lower abdominal pain radiates to back since last night.  Pt is out of pain medication.  Pt took husbands hydrocodone and it made her nauseated.  More frequent urination.  No known fever.

## 2013-09-23 NOTE — ED Provider Notes (Signed)
CSN: [id number]     Arrival date & time 09/23/13  1122 History   First MD Initiated Contact with Patient 09/23/13 1201     Chief Complaint  Patient presents with  . Abdominal Pain     (Consider location/radiation/quality/duration/timing/severity/associated sxs/prior Treatment) Patient is a 53 y.o. female presenting with abdominal pain. The history is provided by the patient. No language interpreter was used.  Abdominal Pain Pain location:  Suprapubic Pain quality: aching   Pain radiates to:  Back Pain severity:  Severe Onset quality:  Gradual Duration: years. Timing:  Constant Progression:  Worsening Context: not alcohol use   Relieved by:  Nothing Worsened by:  Nothing tried Ineffective treatments:  None tried Associated symptoms: nausea   Pt has a history of fibroids and endometriosis.   Pt last seen by her Gyn 3/14.   Pt missed her appointment in May with her Gyn.   Pt has been sen multiple times requesting tramadol for pain.  Pt reports she is applying for disability due to pain  Past Medical History  Diagnosis Date  . Endometriosis   . Hiatal hernia   . Fibromyalgia   . Ovarian cyst   . Arthritis   . Hypertension   . Fibroid tumor    Past Surgical History  Procedure Laterality Date  . Tonsillectomy    . Appendectomy    . Laparoscopy     Family History  Problem Relation Age of Onset  . Kidney disease Mother   . Hypertension Mother    History  Substance Use Topics  . Smoking status: Former Research scientist (life sciences)  . Smokeless tobacco: Never Used  . Alcohol Use: No   OB History   Grav Para Term Preterm Abortions TAB SAB Ect Mult Living   0 0 0 0 0 0 0 0 0 0      Review of Systems  Gastrointestinal: Positive for nausea and abdominal pain.  Genitourinary: Positive for vaginal pain.  All other systems reviewed and are negative.     Allergies  Morphine and related and Rocephin  Home Medications   Prior to Admission medications   Medication Sig Start Date End Date  Taking? Authorizing Provider  lisinopril (PRINIVIL,ZESTRIL) 20 MG tablet Take 1 tablet (20 mg total) by mouth daily. 07/04/13   Shari A Upstill, PA-C  omeprazole (PRILOSEC) 20 MG capsule Take 1 capsule (20 mg total) by mouth daily. 08/17/13   Malvin Johns, MD  ondansetron (ZOFRAN ODT) 4 MG disintegrating tablet 4mg  ODT q4 hours prn nausea/vomit 08/17/13   Malvin Johns, MD  traMADol (ULTRAM) 50 MG tablet Take 1 tablet (50 mg total) by mouth every 6 (six) hours as needed. 08/17/13   Malvin Johns, MD   BP 143/88  Pulse 103  Temp(Src) 98 F (36.7 C) (Oral)  Resp 16  Ht 5\' 3"  (1.6 m)  Wt 215 lb (97.523 kg)  BMI 38.09 kg/m2  SpO2 100% Physical Exam  Nursing note and vitals reviewed. Constitutional: She is oriented to person, place, and time. She appears well-developed and well-nourished.  HENT:  Head: Normocephalic.  Eyes: EOM are normal. Pupils are equal, round, and reactive to light.  Neck: Normal range of motion.  Cardiovascular: Normal rate and regular rhythm.   Pulmonary/Chest: Effort normal and breath sounds normal.  Abdominal: She exhibits no distension. There is tenderness.  Musculoskeletal: Normal range of motion.  Neurological: She is alert and oriented to person, place, and time.  Skin: Skin is warm.  Psychiatric: She has a normal mood and  affect.    ED Course  Procedures (including critical care time) Labs Review Labs Reviewed  URINALYSIS, ROUTINE W REFLEX MICROSCOPIC  PREGNANCY, URINE    Imaging Review No results found.   EKG Interpretation None      Results for orders placed during the hospital encounter of 09/23/13  URINALYSIS, ROUTINE W REFLEX MICROSCOPIC      Result Value Ref Range   Color, Urine YELLOW  YELLOW   APPearance CLOUDY (*) CLEAR   Specific Gravity, Urine 1.013  1.005 - 1.030   pH 7.0  5.0 - 8.0   Glucose, UA NEGATIVE  NEGATIVE mg/dL   Hgb urine dipstick TRACE (*) NEGATIVE   Bilirubin Urine NEGATIVE  NEGATIVE   Ketones, ur NEGATIVE  NEGATIVE  mg/dL   Protein, ur NEGATIVE  NEGATIVE mg/dL   Urobilinogen, UA 0.2  0.0 - 1.0 mg/dL   Nitrite POSITIVE (*) NEGATIVE   Leukocytes, UA SMALL (*) NEGATIVE  URINE MICROSCOPIC-ADD ON      Result Value Ref Range   Squamous Epithelial / LPF RARE  RARE   WBC, UA 11-20  <3 WBC/hpf   RBC / HPF 3-6  <3 RBC/hpf   Bacteria, UA MANY (*) RARE   No results found.   MDM   Final diagnoses:  UTI (lower urinary tract infection)  Chronic pelvic pain in female    I counseled pt.  I advised she needs to be seen by [facility] [place] [facility].  Pt will be given resource guide to assist in finding primary care..  Pt has uti   I will treat with Bactrim (last uti culture reviewed)   Tramadol Phenergan Bactrim    Fransico Meadow, PA-C 09/23/13 1229

## 2013-09-23 NOTE — Discharge Instructions (Signed)
Abdominal Pain, Women °Abdominal (stomach, pelvic, or belly) pain can be caused by many things. It is important to tell your doctor: °· The location of the pain. °· Does it come and go or is it present all the time? °· Are there things that start the pain (eating certain foods, exercise)? °· Are there other symptoms associated with the pain (fever, nausea, vomiting, diarrhea)? °All of this is helpful to know when trying to find the cause of the pain. °CAUSES  °· Stomach: virus or bacteria infection, or ulcer. °· Intestine: appendicitis (inflamed appendix), regional ileitis (Crohn's disease), ulcerative colitis (inflamed colon), irritable bowel syndrome, diverticulitis (inflamed diverticulum of the colon), or cancer of the stomach or intestine. °· Gallbladder disease or stones in the gallbladder. °· Kidney disease, kidney stones, or infection. °· Pancreas infection or cancer. °· Fibromyalgia (pain disorder). °· Diseases of the female organs: °¨ Uterus: fibroid (non-cancerous) tumors or infection. °¨ Fallopian tubes: infection or tubal pregnancy. °¨ Ovary: cysts or tumors. °¨ Pelvic adhesions (scar tissue). °¨ Endometriosis (uterus lining tissue growing in the pelvis and on the pelvic organs). °¨ Pelvic congestion syndrome (female organs filling up with blood just before the menstrual period). °¨ Pain with the menstrual period. °¨ Pain with ovulation (producing an egg). °¨ Pain with an IUD (intrauterine device, birth control) in the uterus. °¨ Cancer of the female organs. °· Functional pain (pain not caused by a disease, may improve without treatment). °· Psychological pain. °· Depression. °DIAGNOSIS  °Your doctor will decide the seriousness of your pain by doing an examination. °· Blood tests. °· X-rays. °· Ultrasound. °· CT scan (computed tomography, special type of X-ray). °· MRI (magnetic resonance imaging). °· Cultures, for infection. °· Barium enema (dye inserted in the large intestine, to better view it with  X-rays). °· Colonoscopy (looking in intestine with a lighted tube). °· Laparoscopy (minor surgery, looking in abdomen with a lighted tube). °· Major abdominal exploratory surgery (looking in abdomen with a large incision). °TREATMENT  °The treatment will depend on the cause of the pain.  °· Many cases can be observed and treated at home. °· Over-the-counter medicines recommended by your caregiver. °· Prescription medicine. °· Antibiotics, for infection. °· Birth control pills, for painful periods or for ovulation pain. °· Hormone treatment, for endometriosis. °· Nerve blocking injections. °· Physical therapy. °· Antidepressants. °· Counseling with a psychologist or psychiatrist. °· Minor or major surgery. °HOME CARE INSTRUCTIONS  °· Do not take laxatives, unless directed by your caregiver. °· Take over-the-counter pain medicine only if ordered by your caregiver. Do not take aspirin because it can cause an upset stomach or bleeding. °· Try a clear liquid diet (broth or water) as ordered by your caregiver. Slowly move to a bland diet, as tolerated, if the pain is related to the stomach or intestine. °· Have a thermometer and take your temperature several times a day, and record it. °· Bed rest and sleep, if it helps the pain. °· Avoid sexual intercourse, if it causes pain. °· Avoid stressful situations. °· Keep your follow-up appointments and tests, as your caregiver orders. °· If the pain does not go away with medicine or surgery, you may try: °¨ Acupuncture. °¨ Relaxation exercises (yoga, meditation). °¨ Group therapy. °¨ Counseling. °SEEK MEDICAL CARE IF:  °· You notice certain foods cause stomach pain. °· Your home care treatment is not helping your pain. °· You need stronger pain medicine. °· You want your IUD removed. °· You feel faint or   lightheaded.  You develop nausea and vomiting.  You develop a rash.  You are having side effects or an allergy to your medicine. SEEK IMMEDIATE MEDICAL CARE IF:   Your  pain does not go away or gets worse.  You have a fever.  Your pain is felt only in portions of the abdomen. The right side could possibly be appendicitis. The left lower portion of the abdomen could be colitis or diverticulitis.  You are passing blood in your stools (bright red or black tarry stools, with or without vomiting).  You have blood in your urine.  You develop chills, with or without a fever.  You pass out. MAKE SURE YOU:   Understand these instructions.  Will watch your condition.  Will get help right away if you are not doing well or get worse. Document Released: 12/23/2006 Document Revised: 05/20/2011 Document Reviewed: 01/12/2009 [facility] Patient Information 2015 [place], [place]. This information is not intended to replace advice given to you by your health care provider. Make sure you discuss any questions you have with your health care provider. Urinary Tract Infection Urinary tract infections (UTIs) can develop anywhere along your urinary tract. Your urinary tract is your body's drainage system for removing wastes and extra water. Your urinary tract includes two kidneys, two ureters, a bladder, and a urethra. Your kidneys are a pair of bean-shaped organs. Each kidney is about the size of your fist. They are located below your ribs, one on each side of your spine. CAUSES Infections are caused by microbes, which are microscopic organisms, including fungi, viruses, and bacteria. These organisms are so small that they can only be seen through a microscope. Bacteria are the microbes that most commonly cause UTIs. SYMPTOMS  Symptoms of UTIs may vary by age and gender of the patient and by the location of the infection. Symptoms in young women typically include a frequent and intense urge to urinate and a painful, burning feeling in the bladder or urethra during urination. Older women and men are more likely to be tired, shaky, and weak and have muscle aches and abdominal pain. A  fever may mean the infection is in your kidneys. Other symptoms of a kidney infection include pain in your back or sides below the ribs, nausea, and vomiting. DIAGNOSIS To diagnose a UTI, your caregiver will ask you about your symptoms. Your caregiver also will ask to provide a urine sample. The urine sample will be tested for bacteria and white blood cells. White blood cells are made by your body to help fight infection. TREATMENT  Typically, UTIs can be treated with medication. Because most UTIs are caused by a bacterial infection, they usually can be treated with the use of antibiotics. The choice of antibiotic and length of treatment depend on your symptoms and the type of bacteria causing your infection. HOME CARE INSTRUCTIONS  If you were prescribed antibiotics, take them exactly as your caregiver instructs you. Finish the medication even if you feel better after you have only taken some of the medication.  Drink enough water and fluids to keep your urine clear or pale yellow.  Avoid caffeine, tea, and carbonated beverages. They tend to irritate your bladder.  Empty your bladder often. Avoid holding urine for long periods of time.  Empty your bladder before and after sexual intercourse.  After a bowel movement, women should cleanse from front to back. Use each tissue only once. SEEK MEDICAL CARE IF:   You have back pain.  You develop a  fever.  Your symptoms do not begin to resolve within 3 days. SEEK IMMEDIATE MEDICAL CARE IF:   You have severe back pain or lower abdominal pain.  You develop chills.  You have nausea or vomiting.  You have continued burning or discomfort with urination. MAKE SURE YOU:   Understand these instructions.  Will watch your condition.  Will get help right away if you are not doing well or get worse. Document Released: 12/05/2004 Document Revised: 08/27/2011 Document Reviewed: 04/05/2011 [facility] Patient Information 2015 [place], [place]. This  information is not intended to replace advice given to you by your health care provider. Make sure you discuss any questions you have with your health care provider.  Emergency Department Resource Guide 1) Find a Doctor and Pay Out of Pocket Although you won't have to find out who is covered by your insurance plan, it is a good idea to ask around and get recommendations. You will then need to call the office and see if the doctor you have chosen will accept you as a new patient and what types of options they offer for patients who are self-pay. Some doctors offer discounts or will set up payment plans for their patients who do not have insurance, but you will need to ask so you aren't surprised when you get to your appointment.  2) Contact Your Local Health Department Not all health departments have doctors that can see patients for sick visits, but many do, so it is worth a call to see if yours does. If you don't know where your local health department is, you can check in your phone book. The CDC also has a tool to help you locate your state's health department, and many state websites also have listings of all of their local health departments.  3) Find a [facility] If your illness is not likely to be very severe or complicated, you may want to try a walk in clinic. These are popping up all over the country in pharmacies, drugstores, and shopping centers. They're usually staffed by nurse practitioners or physician assistants that have been trained to treat common illnesses and complaints. They're usually fairly quick and inexpensive. However, if you have serious medical issues or chronic medical problems, these are probably not your best option.  No Primary Care Doctor: - Call Health Connect at  [phone] - they can help you locate a primary care doctor that  accepts your insurance, provides certain services, etc. - Physician Referral Service- [phone]  Chronic Pain  Problems: Organization         Address  Phone   Notes  [facility]  [phone] Patients need to be referred by their primary care doctor.   Medication Assistance: Organization         Address  Phone   Notes  [facility] Medication [facility] [address] [place], [place] [address] [phone] --Must be a resident of [facility] -- Must have NO insurance coverage whatsoever (no Medicaid/ Medicare, etc.) -- The pt. MUST have a primary care doctor that directs their care regularly and follows them in the community   MedAssist  [phone]   [employer]  [phone]    Agencies that provide inexpensive medical care: Organization         Address  Phone   Notes  [place]  [phone]   [facility]    [phone]   [facility]  Outpatient Clinic [place], [place] [address] [phone]   [place] [address], [place] [phone]   Planned Parenthood    [phone]   [facility]    [phone]   [place] and [address], [place] Phone:  [phone], Fax:  [phone] Hours of Operation:  9 am - 6 pm, M-F.  Also accepts Medicaid/Medicare and self-pay.  [facility] for [place] [address], [address], [place] Phone: [phone], Fax: [phone]. Hours of Operation:  8:30 am - 5:30 pm, M-F.  Also accepts Medicaid and self-pay.  [facility] [place] [address] [place] Phone: [phone]   [name], [place], [place] [phone], Ext. [phone] Mondays & Thursdays: 7-9 AM.  First 15 patients are seen on a first come, first serve basis.    [place] Providers:  Organization         Address  Phone   Notes  [facility] [address], [place] [phone] Also  accepts self-pay patients.  [facility] [address], [place]  [phone]   [place], [address], [place] [phone]   [facility] [address] [place] [phone]   Lucianne Lei [address], [place]   [phone] Only accepts [place] Access [place] patients after they have their name applied to their card.   Self-Pay (no insurance) in [facility]:  Organization         Address  Phone   Notes  Sickle Cell Patients, [facility] Internal [facility] [place] [phone]   [facility] Urgent Care [place] [phone]   [facility] [place]  [place], [place], [place] [phone]   [facility]/Dr. Osei-Bonsu  [address] [place] or [address], [place] [phone] Phone number for both [place] and [place] locations is the same.  [facility] and [facility] [address] [place] [phone]   [facility] [address], [place] or [address] [phone]   [facility] [address], [place] [phone], phone; [phone], fax Sees patients 1st and 3rd Saturday of every month.  Must not qualify for public or private insurance (i.e. Medicaid, Medicare, [place] Health Choice, Veterans' Benefits)  Household income should be no more than 200% of the poverty level The clinic cannot treat you if you are pregnant or think you are pregnant  Sexually transmitted diseases are not treated at the clinic.    Dental Care: Organization         Address  Phone  Notes  [facility] Department of [facility] [place] [phone] Accepts children up to age 48 who are enrolled in [place] or [place]; pregnant  women with a Medicaid card; and children who have applied for Medicaid or [place] Health Choice, but were declined, whose parents can pay a reduced fee at time of service.  [facility] Department of [facility]  [address], [place] [phone] Accepts children up to age 81 who are enrolled  in Medicaid or [place] Health Choice; pregnant women with a Medicaid card; and children who have applied for Medicaid or [place] Health Choice, but were declined, whose parents can pay a reduced fee at time of service.  [place] [facility] PRO[facility]  [place] [phone] Patients are seen by appointment only. Walk-ins are not accepted. [place] will see patients 40 years of age and older. Monday - Tuesday (8am-5pm) Most Wednesdays (8:30-5pm) $30 per visit, cash only  [facility] Access PRO[facility]  [address], [facility] [phone] Patients are seen by appointment only. Walk-ins are not accepted. [place] will see patients 17 years of age and older. One Wednesday Evening (Monthly: Volunteer Based).  $30 per visit, cash only  [place]  [phone] for adults; Children under age 34, call Graduate Pediatric Dentistry at [phone]. Children aged 72-14, please call [phone] to request a pediatric application.  Dental services are provided in all areas of dental care including fillings, crowns and bridges, complete and partial dentures, implants, gum treatment, root canals, and extractions. Preventive care is also provided. Treatment is provided to both adults and children. Patients are selected via a lottery and there is often a waiting list.   [facility] [place] [address], [place]  [phone] [email or web address]   [facility] [address] [place], [place] [phone], Ext. [phone] Second and Fourth Thursday of each month, opens at 6:30 AM; Clinic ends at 9 AM.  Patients are  seen on a first-come first-served basis, and a limited number are seen during each clinic.   [facility]  [address] Hillard Danker [place], [place] [phone]   Eligibility Requirements You must have lived in [place], [place], or [place] counties for at least the last three months.   You cannot be eligible for state or federal sponsored [employer], including [employer], [place], or [employer].   You generally cannot be eligible for healthcare insurance through your employer.    How to apply: Eligibility screenings are held every Tuesday and Wednesday afternoon from 1:00 pm until 4:00 pm. You do not need an appointment for the interview!  [facility] [address] [place], [place]   [place]  [place] Department  [place]  [phone]    Behavioral Health Resources in the Community: Intensive Outpatient Programs Organization         Address  Phone  Notes  [place]. [address], [place], [place] [phone]   [facility] Outpatient [address] [place], [place]   ADS: Alcohol & Drug Svcs [address] [place], [place]   [place] [address],  [place], [place] or [phone]   Substance Abuse Resources Organization         Address  Phone  Notes  Alcohol and Drug Services  [phone]   [place]  [phone]   [facility] [place]   Chinita Pester  [phone]   Residential & Outpatient Substance Abuse Program  6044836119   Psychological Services Organization         Address  Phone  Notes  [facility] [place]  [place]  [phone]   [place] [address] [place] or  [phone]    Mobile Crisis  Teams Organization         Address  Phone  Notes  [facility], Mobile Crisis Care Unit  [phone]   [facility] Psychotherapeutic [facility]  [address] [place], [place]   [facility] [address] [place] [phone]    Self-Help/Support Groups Organization         Address  Phone             Notes  [place]. of [place] - variety of support groups  [place] Call for more information  Narcotics Anonymous (NA), [facility] [address], [employer] [place]  2 meetings at this location   Special educational needs teacher         Address  Phone  Notes  [facility]AP Residential Treatment [place],    [place]  [phone]   [facility]  [address] [place] [id number], [place], [place]   [place], [place] [phone] Admissions: 8am-3pm M-F  Incentives Substance [place] [address],    [place], [place] [phone]   [facility] [address] [place], [place], [place]   [facility] [address]  [place], [place]   [facility] - Intensive Outpatient [address] Kristeen [address], [place], [place]   [facility] ([place]  [place], [place] [phone] or [phone]   [facility] Treatment Services (RTS) [address] [place], [place] Accepts Medicaid  [facility] [place] [address],  [place] [phone] Substance Abuse/Addiction Treatment   [facility] Organization         Address  Phone  Notes  [facility]  [phone]   Domenic Schwab, PhD [address] Arlis [place], [place]   [phone] or [phone]   [place], [place] [phone]   [facility] [address] [place], [place] [phone]  Insurance/Medicaid/sponsorship through [facility] and Families [address] [place]                                    [place], [place] [phone] [place] [address] [place], [place] [phone]    Dr. Adele Schilder  [phone]   [facility] Dept. 1) [address] [place] 2) [place] 3)  [place] [address], [place] [phone]  [phone]   [place] [phone] or [phone] (After Hours)

## 2013-09-24 NOTE — ED Provider Notes (Signed)
Medical screening examination/treatment/procedure(s) were performed by non-physician practitioner and as supervising physician I was immediately available for consultation/collaboration.   EKG Interpretation None        Mariea Clonts, MD 09/24/13 [phone]

## 2013-10-28 ENCOUNTER — Emergency Department (HOSPITAL_BASED_OUTPATIENT_CLINIC_OR_DEPARTMENT_OTHER)
Admission: EM | Admit: 2013-10-28 | Discharge: 2013-10-28 | Disposition: A | Discharge status: Home / Self Care | Payer: Medicaid Other | Attending: Emergency Medicine | Admitting: Emergency Medicine

## 2013-10-28 ENCOUNTER — Encounter (HOSPITAL_COMMUNITY): Payer: Self-pay | Admitting: Emergency Medicine

## 2013-10-28 ENCOUNTER — Emergency Department (HOSPITAL_BASED_OUTPATIENT_CLINIC_OR_DEPARTMENT_OTHER): Payer: Medicaid Other

## 2013-10-28 ENCOUNTER — Emergency Department (HOSPITAL_COMMUNITY)
Admission: EM | Admit: 2013-10-28 | Discharge: 2013-10-28 | Disposition: A | Discharge status: Home / Self Care | Payer: Medicaid Other | Source: Home / Self Care | Attending: Emergency Medicine | Admitting: Emergency Medicine

## 2013-10-28 ENCOUNTER — Encounter (HOSPITAL_BASED_OUTPATIENT_CLINIC_OR_DEPARTMENT_OTHER): Payer: Self-pay | Admitting: Emergency Medicine

## 2013-10-28 DIAGNOSIS — Z8739 Personal history of other diseases of the musculoskeletal system and connective tissue: Secondary | ICD-10-CM

## 2013-10-28 DIAGNOSIS — Z8719 Personal history of other diseases of the digestive system: Secondary | ICD-10-CM

## 2013-10-28 DIAGNOSIS — IMO0001 Reserved for inherently not codable concepts without codable children: Secondary | ICD-10-CM | POA: Insufficient documentation

## 2013-10-28 DIAGNOSIS — S0993XA Unspecified injury of face, initial encounter: Secondary | ICD-10-CM | POA: Insufficient documentation

## 2013-10-28 DIAGNOSIS — S199XXA Unspecified injury of neck, initial encounter: Principal | ICD-10-CM

## 2013-10-28 DIAGNOSIS — Z79899 Other long term (current) drug therapy: Secondary | ICD-10-CM

## 2013-10-28 DIAGNOSIS — Y92410 Unspecified street and highway as the place of occurrence of the external cause: Secondary | ICD-10-CM | POA: Insufficient documentation

## 2013-10-28 DIAGNOSIS — R51 Headache: Secondary | ICD-10-CM

## 2013-10-28 DIAGNOSIS — Y9389 Activity, other specified: Secondary | ICD-10-CM | POA: Insufficient documentation

## 2013-10-28 DIAGNOSIS — G8911 Acute pain due to trauma: Secondary | ICD-10-CM | POA: Insufficient documentation

## 2013-10-28 DIAGNOSIS — M5030 Other cervical disc degeneration, unspecified cervical region: Secondary | ICD-10-CM | POA: Diagnosis not present

## 2013-10-28 DIAGNOSIS — I10 Essential (primary) hypertension: Secondary | ICD-10-CM

## 2013-10-28 DIAGNOSIS — Z87448 Personal history of other diseases of urinary system: Secondary | ICD-10-CM | POA: Insufficient documentation

## 2013-10-28 DIAGNOSIS — Z760 Encounter for issue of repeat prescription: Secondary | ICD-10-CM

## 2013-10-28 DIAGNOSIS — Z87891 Personal history of nicotine dependence: Secondary | ICD-10-CM | POA: Insufficient documentation

## 2013-10-28 DIAGNOSIS — M542 Cervicalgia: Secondary | ICD-10-CM | POA: Insufficient documentation

## 2013-10-28 DIAGNOSIS — S0990XA Unspecified injury of head, initial encounter: Secondary | ICD-10-CM | POA: Diagnosis not present

## 2013-10-28 DIAGNOSIS — R519 Headache, unspecified: Secondary | ICD-10-CM

## 2013-10-28 MED ORDER — TRAMADOL HCL 50 MG PO TABS
50.0000 mg | ORAL_TABLET | Freq: Once | ORAL | Status: AC
  Administered 2013-10-28: 50 mg via ORAL
  Filled 2013-10-28: qty 1

## 2013-10-28 MED ORDER — CYCLOBENZAPRINE HCL 5 MG PO TABS: 5.0000 mg | ORAL_TABLET | Freq: Two times a day (BID) | ORAL | Status: AC | PRN
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
## 2013-10-28 NOTE — Discharge Instructions (Signed)
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
## 2013-10-28 NOTE — Discharge Instructions (Signed)
Follow up with your doctor for further evaluation. Refer to attached documents for more information.  °

## 2013-11-01 NOTE — ED Provider Notes (Signed)
History/physical exam/procedure(s) were performed by non-physician practitioner and as supervising physician I was immediately available for consultation/collaboration. I have reviewed all notes and am in agreement with care and plan.   Shaune Pollack, MD 11/01/13 2025

## 2013-11-02 NOTE — ED Provider Notes (Signed)
Medical screening examination/treatment/procedure(s) were performed by non-physician practitioner and as supervising physician I was immediately available for consultation/collaboration.   EKG Interpretation None       Virgel Manifold, MD 11/02/13 [phone]

## 2014-02-08 ENCOUNTER — Encounter (HOSPITAL_BASED_OUTPATIENT_CLINIC_OR_DEPARTMENT_OTHER): Payer: Self-pay | Admitting: *Deleted

## 2014-02-08 ENCOUNTER — Emergency Department (HOSPITAL_BASED_OUTPATIENT_CLINIC_OR_DEPARTMENT_OTHER)
Admission: EM | Admit: 2014-02-08 | Discharge: 2014-02-08 | Disposition: A | Discharge status: Home / Self Care | Payer: Medicaid Other | Attending: Emergency Medicine | Admitting: Emergency Medicine

## 2014-02-08 DIAGNOSIS — Z87891 Personal history of nicotine dependence: Secondary | ICD-10-CM | POA: Insufficient documentation

## 2014-02-08 DIAGNOSIS — Z8739 Personal history of other diseases of the musculoskeletal system and connective tissue: Secondary | ICD-10-CM | POA: Diagnosis not present

## 2014-02-08 DIAGNOSIS — Z8742 Personal history of other diseases of the female genital tract: Secondary | ICD-10-CM | POA: Diagnosis not present

## 2014-02-08 DIAGNOSIS — R1110 Vomiting, unspecified: Secondary | ICD-10-CM | POA: Insufficient documentation

## 2014-02-08 DIAGNOSIS — Z79899 Other long term (current) drug therapy: Secondary | ICD-10-CM | POA: Insufficient documentation

## 2014-02-08 DIAGNOSIS — Z8719 Personal history of other diseases of the digestive system: Secondary | ICD-10-CM | POA: Diagnosis not present

## 2014-02-08 DIAGNOSIS — R102 Pelvic and perineal pain: Secondary | ICD-10-CM | POA: Insufficient documentation

## 2014-02-08 DIAGNOSIS — I10 Essential (primary) hypertension: Secondary | ICD-10-CM | POA: Diagnosis not present

## 2014-02-08 DIAGNOSIS — G8929 Other chronic pain: Secondary | ICD-10-CM | POA: Insufficient documentation

## 2014-02-08 LAB — URINALYSIS, ROUTINE W REFLEX MICROSCOPIC
Bilirubin Urine: NEGATIVE
GLUCOSE, UA: NEGATIVE mg/dL
Hgb urine dipstick: NEGATIVE
Ketones, ur: NEGATIVE mg/dL
Leukocytes, UA: NEGATIVE
Nitrite: NEGATIVE
Protein, ur: NEGATIVE mg/dL
Specific Gravity, Urine: 1.005 (ref 1.005–1.030)
Urobilinogen, UA: 0.2 mg/dL (ref 0.0–1.0)
pH: 6 (ref 5.0–8.0)

## 2014-02-08 MED ORDER — HYDROMORPHONE HCL 1 MG/ML IJ SOLN
2.0000 mg | Freq: Once | INTRAMUSCULAR | Status: AC
  Administered 2014-02-08: 2 mg via INTRAMUSCULAR
  Filled 2014-02-08: qty 2

## 2014-02-08 MED ORDER — TRAMADOL HCL 50 MG PO TABS: 50.0000 mg | ORAL_TABLET | Freq: Four times a day (QID) | ORAL | Status: AC | PRN

## 2014-02-08 MED ORDER — ONDANSETRON HCL 4 MG/2ML IJ SOLN
4.0000 mg | Freq: Once | INTRAMUSCULAR | Status: AC
  Administered 2014-02-08: 4 mg via INTRAMUSCULAR
  Filled 2014-02-08: qty 2

## 2014-02-08 MED ORDER — PROMETHAZINE HCL 25 MG PO TABS: 25.0000 mg | ORAL_TABLET | Freq: Four times a day (QID) | ORAL | Status: AC | PRN

## 2014-02-08 NOTE — Discharge Instructions (Signed)

## 2014-02-08 NOTE — ED Provider Notes (Signed)
CSN: [id number]     Arrival date & time 02/08/14  1054 History   First MD Initiated Contact with Patient 02/08/14 1211     Chief Complaint  Patient presents with  . Emesis     (Consider location/radiation/quality/duration/timing/severity/associated sxs/prior Treatment) Patient is a 53 y.o. female presenting with vomiting. The history is provided by the patient. No language interpreter was used.  Emesis Severity:  Moderate Timing:  Constant Number of daily episodes:  Multiple Progression:  Worsening Chronicity:  New Recent urination:  Normal Relieved by:  Nothing Worsened by:  Nothing tried Ineffective treatments:  None tried Associated symptoms: abdominal pain   Pt has chronic pelvic pain.  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   Pt reports she has not been seen by gyn for assessment recently  Past Medical History  Diagnosis Date  . Endometriosis   . Hiatal hernia   . Fibromyalgia   . Ovarian cyst   . Arthritis   . Hypertension   . Fibroid tumor    Past Surgical History  Procedure Laterality Date  . Tonsillectomy    . Appendectomy    . Laparoscopy     Family History  Problem Relation Age of Onset  . Kidney disease Mother   . Hypertension Mother    History  Substance Use Topics  . Smoking status: Former Research scientist (life sciences)  . Smokeless tobacco: Never Used  . Alcohol Use: No   OB History    Gravida Para Term Preterm AB TAB SAB Ectopic Multiple Living   0 0 0 0 0 0 0 0 0 0      Review of Systems  Gastrointestinal: Positive for vomiting and abdominal pain.  All other systems reviewed and are negative.     Allergies  Morphine and related and Rocephin  Home Medications   Prior to Admission medications   Medication Sig Start Date End Date Taking? Authorizing Provider  lisinopril (PRINIVIL,ZESTRIL) 20 MG tablet Take 1 tablet (20 mg total) by mouth daily. 07/04/13  Yes Shari A Upstill, PA-C  cyclobenzaprine (FLEXERIL) 5 MG tablet Take 1 tablet (5 mg total) by  mouth 2 (two) times daily as needed. 10/28/13   Glendell Docker, NP  omeprazole (PRILOSEC) 20 MG capsule Take 1 capsule (20 mg total) by mouth daily. 08/17/13   Malvin Johns, MD  ondansetron (ZOFRAN ODT) 4 MG disintegrating tablet 4mg  ODT q4 hours prn nausea/vomit 08/17/13   Malvin Johns, MD  promethazine (PHENERGAN) 25 MG tablet Take 1 tablet (25 mg total) by mouth every 6 (six) hours as needed for nausea or vomiting. 02/08/14   Fransico Meadow, PA-C  traMADol (ULTRAM) 50 MG tablet Take 1 tablet (50 mg total) by mouth every 6 (six) hours as needed. 02/08/14   Fransico Meadow, PA-C   BP 157/93 mmHg  Pulse 100  Temp(Src) 98.8 F (37.1 C) (Oral)  Resp 18  Ht 5' 3.5" (1.613 m)  Wt 210 lb (95.255 kg)  BMI 36.61 kg/m2  SpO2 95%  LMP  Physical Exam  Constitutional: She is oriented to person, place, and time. She appears well-developed and well-nourished.  HENT:  Head: Normocephalic.  Eyes: EOM are normal.  Neck: Normal range of motion.  Cardiovascular: Normal rate and normal heart sounds.   Pulmonary/Chest: Effort normal and breath sounds normal.  Abdominal: Soft. She exhibits no distension.  Musculoskeletal: Normal range of motion.  Neurological: She is alert and oriented to person, place, and time.  Psychiatric: She has a normal mood and affect.  Nursing note and vitals reviewed.   ED Course  Procedures (including critical care time) Labs Review Labs Reviewed  URINALYSIS, ROUTINE W REFLEX MICROSCOPIC    Imaging Review No results found.   EKG Interpretation None      MDM  Pt has chronic pain.  No new complaints.   [facility] advised [facility] follow [facility]   Final diagnoses:  Chronic pelvic pain in female     Rx for tramadol and phenergan Pt advised to call [facility] to be seen for evaluation  Fransico Meadow, PA-C 02/08/14 [place], MD 02/08/14 1546

## 2014-02-08 NOTE — ED Notes (Signed)
C/o n/v/d x 10 days. Pt states she is taking BC powder and tylenol for this pain because she ran out of tramadol. C/o low left abd pain thru to back. States she takes scalding baths x 4 a day for pain. C/o burning with urination.
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
